# Patient Record
Sex: Female | Born: 1935 | Race: Black or African American | Hispanic: No | Smoking: Never smoker
Health system: Southern US, Community
[De-identification: ages and names within clinical notes are randomized; demographics above are authoritative.]

## PROBLEM LIST (undated history)

## (undated) DIAGNOSIS — I10 Essential (primary) hypertension: Secondary | ICD-10-CM

## (undated) DIAGNOSIS — E119 Type 2 diabetes mellitus without complications: Secondary | ICD-10-CM

## (undated) HISTORY — PX: CHOLECYSTECTOMY: SHX55

---

## 2015-06-18 ENCOUNTER — Inpatient Hospital Stay (HOSPITAL_COMMUNITY)
Admission: AD | Admit: 2015-06-18 | Discharge: 2015-06-24 | DRG: 194 | Disposition: A | Discharge status: Home / Self Care | Payer: Medicare Other | Source: Other Acute Inpatient Hospital | Attending: Internal Medicine | Admitting: Internal Medicine

## 2015-06-18 ENCOUNTER — Encounter (HOSPITAL_COMMUNITY): Payer: Self-pay

## 2015-06-18 DIAGNOSIS — N179 Acute kidney failure, unspecified: Secondary | ICD-10-CM | POA: Diagnosis not present

## 2015-06-18 DIAGNOSIS — I10 Essential (primary) hypertension: Secondary | ICD-10-CM | POA: Diagnosis not present

## 2015-06-18 DIAGNOSIS — J189 Pneumonia, unspecified organism: Principal | ICD-10-CM | POA: Diagnosis present

## 2015-06-18 DIAGNOSIS — Z66 Do not resuscitate: Secondary | ICD-10-CM | POA: Diagnosis present

## 2015-06-18 DIAGNOSIS — K529 Noninfective gastroenteritis and colitis, unspecified: Secondary | ICD-10-CM | POA: Diagnosis present

## 2015-06-18 DIAGNOSIS — R918 Other nonspecific abnormal finding of lung field: Secondary | ICD-10-CM | POA: Diagnosis not present

## 2015-06-18 DIAGNOSIS — Z7982 Long term (current) use of aspirin: Secondary | ICD-10-CM

## 2015-06-18 DIAGNOSIS — E119 Type 2 diabetes mellitus without complications: Secondary | ICD-10-CM | POA: Diagnosis not present

## 2015-06-18 DIAGNOSIS — R062 Wheezing: Secondary | ICD-10-CM

## 2015-06-18 DIAGNOSIS — R05 Cough: Secondary | ICD-10-CM

## 2015-06-18 DIAGNOSIS — E860 Dehydration: Secondary | ICD-10-CM | POA: Diagnosis present

## 2015-06-18 DIAGNOSIS — R911 Solitary pulmonary nodule: Secondary | ICD-10-CM | POA: Diagnosis present

## 2015-06-18 DIAGNOSIS — R059 Cough, unspecified: Secondary | ICD-10-CM

## 2015-06-18 DIAGNOSIS — I5032 Chronic diastolic (congestive) heart failure: Secondary | ICD-10-CM | POA: Diagnosis present

## 2015-06-18 HISTORY — DX: Essential (primary) hypertension: I10

## 2015-06-18 HISTORY — DX: Type 2 diabetes mellitus without complications: E11.9

## 2015-06-18 LAB — CREATININE, SERUM
CREATININE: 1.64 mg/dL — AB (ref 0.44–1.00)
GFR, EST AFRICAN AMERICAN: 31 mL/min — AB (ref >60)
GFR, EST NON AFRICAN AMERICAN: 27 mL/min — AB (ref >60)

## 2015-06-18 LAB — CBC
HEMATOCRIT: 39.6 % (ref 36.0–46.0)
HEMOGLOBIN: 13 g/dL (ref 12.0–15.0)
MCH: 28.8 pg (ref 26.0–34.0)
MCHC: 32.8 g/dL (ref 30.0–36.0)
MCV: 87.8 fL (ref 78.0–100.0)
Platelets: 255 10*3/uL (ref 150–400)
RBC: 4.51 MIL/uL (ref 3.87–5.11)
RDW: 15.5 % (ref 11.5–15.5)
WBC: 13.8 10*3/uL — AB (ref 4.0–10.5)

## 2015-06-18 LAB — GLUCOSE, CAPILLARY
GLUCOSE-CAPILLARY: 131 mg/dL — AB (ref 65–99)
GLUCOSE-CAPILLARY: 164 mg/dL — AB (ref 65–99)

## 2015-06-18 MED ORDER — ATORVASTATIN CALCIUM 20 MG PO TABS
20.0000 mg | ORAL_TABLET | Freq: Every day | ORAL | Status: DC
  Administered 2015-06-18 – 2015-06-24 (×7): 20 mg via ORAL
  Filled 2015-06-18 (×7): qty 1

## 2015-06-18 MED ORDER — ASPIRIN 81 MG PO CHEW
81.0000 mg | CHEWABLE_TABLET | Freq: Every day | ORAL | Status: DC
  Administered 2015-06-18 – 2015-06-24 (×7): 81 mg via ORAL
  Filled 2015-06-18 (×7): qty 1

## 2015-06-18 MED ORDER — SODIUM CHLORIDE 0.9 % IV SOLN: 250.0000 mL | INTRAVENOUS | Status: DC | PRN

## 2015-06-18 MED ORDER — LORATADINE 10 MG PO TABS
10.0000 mg | ORAL_TABLET | Freq: Every day | ORAL | Status: DC
  Administered 2015-06-19 – 2015-06-24 (×6): 10 mg via ORAL
  Filled 2015-06-18 (×7): qty 1

## 2015-06-18 MED ORDER — SODIUM CHLORIDE 0.9% FLUSH
3.0000 mL | Freq: Two times a day (BID) | INTRAVENOUS | Status: DC
  Administered 2015-06-19 – 2015-06-22 (×4): 3 mL via INTRAVENOUS

## 2015-06-18 MED ORDER — SODIUM CHLORIDE 0.9% FLUSH: 3.0000 mL | INTRAVENOUS | Status: DC | PRN

## 2015-06-18 MED ORDER — DEXTROSE 5 % IV SOLN
1.0000 g | Freq: Three times a day (TID) | INTRAVENOUS | Status: DC
  Administered 2015-06-18 – 2015-06-19 (×2): 1 g via INTRAVENOUS
  Filled 2015-06-18 (×4): qty 1

## 2015-06-18 MED ORDER — AMLODIPINE BESYLATE 10 MG PO TABS
10.0000 mg | ORAL_TABLET | Freq: Every day | ORAL | Status: DC
  Administered 2015-06-19: 10 mg via ORAL
  Filled 2015-06-18 (×3): qty 1

## 2015-06-18 MED ORDER — INSULIN ASPART 100 UNIT/ML ~~LOC~~ SOLN: 0.0000 [IU] | Freq: Every day | SUBCUTANEOUS | Status: DC

## 2015-06-18 MED ORDER — CARVEDILOL 25 MG PO TABS
25.0000 mg | ORAL_TABLET | Freq: Two times a day (BID) | ORAL | Status: DC
  Administered 2015-06-18 – 2015-06-24 (×12): 25 mg via ORAL
  Filled 2015-06-18 (×12): qty 1

## 2015-06-18 MED ORDER — ALUM & MAG HYDROXIDE-SIMETH 200-200-20 MG/5ML PO SUSP: 30.0000 mL | Freq: Four times a day (QID) | ORAL | Status: DC | PRN

## 2015-06-18 MED ORDER — ACETAMINOPHEN 650 MG RE SUPP: 650.0000 mg | Freq: Four times a day (QID) | RECTAL | Status: DC | PRN

## 2015-06-18 MED ORDER — LISINOPRIL-HYDROCHLOROTHIAZIDE 20-12.5 MG PO TABS: 1.0000 | ORAL_TABLET | Freq: Every day | ORAL | Status: DC

## 2015-06-18 MED ORDER — ONDANSETRON HCL 4 MG/2ML IJ SOLN
4.0000 mg | Freq: Four times a day (QID) | INTRAMUSCULAR | Status: DC | PRN
  Administered 2015-06-22: 4 mg via INTRAVENOUS
  Filled 2015-06-18: qty 2

## 2015-06-18 MED ORDER — LEVOCETIRIZINE DIHYDROCHLORIDE 5 MG PO TABS: 5.0000 mg | ORAL_TABLET | Freq: Two times a day (BID) | ORAL | Status: DC

## 2015-06-18 MED ORDER — ONDANSETRON HCL 4 MG PO TABS: 4.0000 mg | ORAL_TABLET | Freq: Four times a day (QID) | ORAL | Status: DC | PRN

## 2015-06-18 MED ORDER — POTASSIUM CHLORIDE CRYS ER 20 MEQ PO TBCR
20.0000 meq | EXTENDED_RELEASE_TABLET | Freq: Every day | ORAL | Status: DC
  Administered 2015-06-19: 20 meq via ORAL
  Filled 2015-06-18 (×2): qty 1

## 2015-06-18 MED ORDER — INSULIN ASPART 100 UNIT/ML ~~LOC~~ SOLN
0.0000 [IU] | Freq: Three times a day (TID) | SUBCUTANEOUS | Status: DC
  Administered 2015-06-18 – 2015-06-19 (×3): 1 [IU] via SUBCUTANEOUS
  Administered 2015-06-20: 2 [IU] via SUBCUTANEOUS
  Administered 2015-06-20: 1 [IU] via SUBCUTANEOUS
  Administered 2015-06-21: 2 [IU] via SUBCUTANEOUS
  Administered 2015-06-21 – 2015-06-22 (×2): 1 [IU] via SUBCUTANEOUS
  Administered 2015-06-22: 2 [IU] via SUBCUTANEOUS
  Administered 2015-06-22 – 2015-06-23 (×3): 3 [IU] via SUBCUTANEOUS
  Administered 2015-06-24: 1 [IU] via SUBCUTANEOUS

## 2015-06-18 MED ORDER — ACETAMINOPHEN 325 MG PO TABS: 650.0000 mg | ORAL_TABLET | Freq: Four times a day (QID) | ORAL | Status: DC | PRN

## 2015-06-18 MED ORDER — ENOXAPARIN SODIUM 40 MG/0.4ML ~~LOC~~ SOLN
40.0000 mg | SUBCUTANEOUS | Status: DC
  Administered 2015-06-18 – 2015-06-19 (×2): 40 mg via SUBCUTANEOUS
  Filled 2015-06-18 (×2): qty 0.4

## 2015-06-18 MED ORDER — DEXTROSE 5 % IV SOLN
500.0000 mg | INTRAVENOUS | Status: DC
  Administered 2015-06-19: 500 mg via INTRAVENOUS
  Filled 2015-06-18: qty 500

## 2015-06-18 NOTE — Progress Notes (Signed)
ANTIBIOTIC CONSULT NOTE - INITIAL  Pharmacy Consult for Azactam Indication: pneumonia  Allergies  Allergen Reactions  . Levaquin [Levofloxacin In D5w] Other (See Comments)    Veins redden per [place] ,[place] [facility] 06/18/15  . Penicillins Swelling and Other (See Comments)    hallucination    Patient Measurements: Height:  (165.1 cm) Weight: 233 lb 14.4 oz (106.096 kg) IBW/kg (Calculated) : 57 Adjusted Body Weight: n/a  Vital Signs: Temp: 99.1 F (37.3 C) (02/04 1457) Temp Source: Oral (02/04 1457) BP: 113/55 mmHg (02/04 1457) Pulse Rate: 73 (02/04 1457) Intake/Output from previous day:   Intake/Output from this shift:    Labs: From [place] : Scr 1.62, est CrCl ~ 25 ml/min  Microbiology: No results found for this or any previous visit (from the past 720 hour(s)).  Assessment: 82 yo female transferred from [facility] with vomiting and diarrhea, hypoxic.  Pharmacy asked to begin empiric antibiotic coverage for suspected PNA.  Allergy to levaquin and PCN.  Received azactam at [facility] around 10 AM today.    Goal of Therapy:  Resolution of infection  Plan:  1. Azactam 1g IV q 8 hrs. 2. F/u renal function, cultures and clinical course.  Tad Moore, BCPS  Clinical Pharmacist Pager [phone]  06/18/2015 4:36 PM

## 2015-06-18 NOTE — H&P (Signed)
[facility] History and Physical  Ann Garrison ZOX:[id number] DOB: 13-Jun-1925 DOA: 06/18/2015   PCP: No primary care provider on file.    Chief Complaint: Vomiting and diarrhea  HPI: Ann Garrison is a 82 y.o. female history of hypertension and diabetes mellitus who presents to the hospital for vomiting and diarrhea that started last night shortly after 11 PM.  She continued to have at this morning and never presented to the hospital.  She initially presented to the hospital in [place].  She was receiving a fluid bolus when she was suddenly noticed to be hypoxic.  Further workup revealed bilateral pulmonary infiltrates.  It was suspected that she may have pulmonary edema and therefore she was given Lasix however, a BNP which was obtained was found to be normal.  It was subsequently suspected that she may have pneumonia.  The patient does not admit to any shortness of breath cough fever or chills.  She was given IV Levaquin in the [facility] but developed redness and itching and therefore this was continued.  She was then given IV Azactam subsequently transported to [facility] asked me were no beds available in [place]. Here she states that her nausea vomiting and diarrhea have not recurred since early this morning.  She does not have any abdominal pain.  He states that she went out to eat on Wednesday with her family.  Both her and her daughter had a salad and her daughter developed nausea and vomiting immediately however the patient's symptoms did not start until last night.   She continues to deny cough, chest congestion, sore throat, runny nose, fevers and chills.    General: The patient denies anorexia, fever, weight loss Cardiac: Denies chest pain, syncope, palpitations, pedal edema  Respiratory: Denies cough, shortness of breath, wheezing GI: Denies severe indigestion/heartburn, abdominal pain, nausea, vomiting, diarrhea and constipation GU: Denies hematuria,  incontinence, dysuria  Musculoskeletal: Denies arthritis  Skin: Denies suspicious skin lesions Neurologic: Denies focal weakness or numbness, change in vision Psychiatry: Denies depression or anxiety. Hematologic: no easy bruising or bleeding  All other systems reviewed and found to be negative.  No past medical history on file.  No past surgical history on file.  Social History:* does not smoke or drink. Lives at home. pretty active at home.    Allergies  Allergen Reactions  . Levaquin [Levofloxacin In D5w] Other (See Comments)    Veins redden [facility]  . Penicillins Swelling and Other (See Comments)    hallucination    Family history:   Per patient, family history of cancer her heart disease    Prior to Admission medications   Medication Sig Start Date End Date Taking? Authorizing Provider  amLODipine (NORVASC) 10 MG tablet Take 10 mg by mouth daily.   Yes Historical Provider, MD  aspirin 81 MG chewable tablet Chew by mouth daily.   Yes Historical Provider, MD  atorvastatin (LIPITOR) 20 MG tablet Take 20 mg by mouth daily.   Yes Historical Provider, MD  carvedilol (COREG) 25 MG tablet Take 25 mg by mouth 2 (two) times daily with a meal.   Yes Historical Provider, MD  glimepiride (AMARYL) 4 MG tablet Take 4 mg by mouth daily with breakfast.   Yes Historical Provider, MD  levocetirizine (XYZAL) 5 MG tablet Take 5 mg by mouth 2 (two) times daily.   Yes Historical Provider, MD  lisinopril-hydrochlorothiazide (PRINZIDE,ZESTORETIC) 20-12.5 MG tablet Take 1 tablet by mouth daily.   Yes Historical Provider, MD  potassium chloride SA (K-DUR,KLOR-CON) 20 MEQ tablet Take 20 mEq by mouth daily.   Yes Historical Provider, MD     Physical Exam: Filed Vitals:   06/18/15 1457  BP: 113/55  Pulse: 73  Temp: 99.1 F (37.3 C)  TempSrc: Oral  Resp: 16  Height:  (1.651 m)  Weight: 106.096 kg (233 lb 14.4 oz)  SpO2: 96%     General: awake alert oriented 3, in  no acute distress.   HEENT: Normocephalic and Atraumatic, Mucous membranes pink                PERRLA; EOM intact; No scleral icterus,                 Nares: Patent, Oropharynx: Clear, Fair Dentition                 Neck: FROM, no cervical lymphadenopathy, thyromegaly, carotid bruit or JVD;  Breasts: deferred CHEST WALL: No tenderness  CHEST: Normal respiration, clear to auscultation bilaterally  HEART: Regular rate and rhythm; no murmurs rubs or gallops  BACK: No kyphosis or scoliosis; no CVA tenderness  GI: Positive Bowel Sounds, soft, non-tender; no masses, no organomegaly Rectal Exam: deferred MSK: No cyanosis, clubbing, or edema Genitalia: not examined  SKIN:  no rash or ulceration  CNS: Alert and Oriented x 4, Nonfocal exam, CN 2-12 intact  Labs on Admission:    lab work from [facility] in paper chart has been reviewed - sodium 141, potassium 3.5, chloride 102, carbon dioxide 30, BUN 28, creatinine 1.62   Total bilirubin 0.57, AST 25, ALT 20, alkaline phosphatase 63   Telemetry DC'd 9.5, hemoglobin 13.3, hematocrit 42 , platelets 340   UA reveals 2+ leukocytes and 20-40 WBCs many bacteria and a moderate amount of epithelial cells   Chest x-ray reveals bilateral basilar infiltrates per report in chart EKG: Independently reviewed.   Assessment/Plan Principal Problem:   Gastroenteritis -  Presenting with symptoms of vomiting and diarrhea which already seem to be improving-she has no abdominal tenderness- last episode of vomiting was around 6 AM -  We'll give symptomatic treatment with Zofran when necessary - placed on clear liquids & monitor for improvement  Active Problems: Pulmonary infiltrates on CXR -  Uncertain if this is secondary to underlying pneumonia she is asymptomatic in regards to pneumonia - The probability of heart failure is low as BNP is normal  We'll obtain a 2-D echo  to further Evaluate - currently oxygen saturation is 96% on room air -  she  receivedIV Levaquin in the ER but due to an allergic reaction, she was switched to Azactam-if this actually is community-acquired pneumonia, she will need atypical coverage and therefore will add Zithromax as well - repeat chest x-ray tomorrow morning to see if infiltrates have resolved which would mean that they may have been secondary to pulmonary edema rather than pneumonia -  Influenza checked in [place] was negative  Allergic reaction to Levaquin -  Has been noted in the chart  Renal failure- unspecified - BUN 28 /creatinine 1.62 - as there is no prior blood work to compare with, I am unable to tell if this is acute or chronic - unable to give IV fluids as we are currently investigating to see if she has heart failure - recheck tomorrow morning-     Pyuria - Contaminated UA due to significant amount of epithelial cells - No complaints of dysuria , increased frequency or suprapubic pain -doubtful that she has  a UTI    DM type 2 goal A1C below 7.5 -   hold Amaryl and place on sliding scale insulin    Benign essential HTN   -   Resume home medications with holding parameters   Consulted:   Code Status: DNR Family Communication:   DVT Prophylaxis:Lovenox  Time spent: 60 minutes  Docia Klar, MD [facility]  If 7PM-7AM, please contact night-coverage [email or web address] 06/18/2015, 4:10 PM

## 2015-06-18 NOTE — Progress Notes (Signed)
Ann Garrison [id number] Admission Data: 06/18/2015 3:24 PM Attending Provider: Calvert Cantor, MD  PCP:No primary care provider on file. Consults/ Treatment Team:    Ann Garrison is a 82 y.o. female patient admitted from ED awake, alert  & orientated  X 3,  No Order, VSS - Blood pressure 113/55, pulse 73, temperature 99.1 F (37.3 C), temperature source Oral, resp. rate 16, SpO2 96 %., O2    2 L nasal cannular, no c/o shortness of breath, no c/o chest pain, no distress noted.   IV site WDL:  Left forearm with a transparent dsg that's clean dry and intact.  Allergies: Penicillin, Levoquin  No past medical history on file.  Pt orientation to unit, room and routine. Information packet given to patient/family and safety video watched.  Admission INP armband ID verified with patient/family, and in place. SR up x 2, fall risk assessment complete with Patient and family verbalizing understanding of risks associated with falls. Pt verbalizes an understanding of how to use the call bell and to call for help before getting out of bed.  Skin, clean-dry- intact without evidence of bruising, or skin tears.   No evidence of skin break down noted on exam. Skin assessment done with Velva Harman.   Will cont to monitor and assist as needed.  Tobin Chad, RN 06/18/2015 3:26 PM

## 2015-06-19 ENCOUNTER — Encounter (HOSPITAL_COMMUNITY): Payer: Self-pay

## 2015-06-19 ENCOUNTER — Observation Stay (HOSPITAL_BASED_OUTPATIENT_CLINIC_OR_DEPARTMENT_OTHER): Payer: Medicare Other

## 2015-06-19 ENCOUNTER — Observation Stay (HOSPITAL_COMMUNITY): Payer: Medicare Other

## 2015-06-19 DIAGNOSIS — I10 Essential (primary) hypertension: Secondary | ICD-10-CM | POA: Diagnosis present

## 2015-06-19 DIAGNOSIS — R918 Other nonspecific abnormal finding of lung field: Secondary | ICD-10-CM | POA: Diagnosis not present

## 2015-06-19 DIAGNOSIS — Z66 Do not resuscitate: Secondary | ICD-10-CM | POA: Diagnosis present

## 2015-06-19 DIAGNOSIS — E119 Type 2 diabetes mellitus without complications: Secondary | ICD-10-CM | POA: Diagnosis present

## 2015-06-19 DIAGNOSIS — Z7982 Long term (current) use of aspirin: Secondary | ICD-10-CM | POA: Diagnosis not present

## 2015-06-19 DIAGNOSIS — N179 Acute kidney failure, unspecified: Secondary | ICD-10-CM | POA: Diagnosis not present

## 2015-06-19 DIAGNOSIS — R079 Chest pain, unspecified: Secondary | ICD-10-CM | POA: Diagnosis not present

## 2015-06-19 DIAGNOSIS — E860 Dehydration: Secondary | ICD-10-CM | POA: Diagnosis present

## 2015-06-19 DIAGNOSIS — K529 Noninfective gastroenteritis and colitis, unspecified: Secondary | ICD-10-CM | POA: Diagnosis present

## 2015-06-19 DIAGNOSIS — J189 Pneumonia, unspecified organism: Secondary | ICD-10-CM | POA: Diagnosis present

## 2015-06-19 DIAGNOSIS — R911 Solitary pulmonary nodule: Secondary | ICD-10-CM | POA: Diagnosis present

## 2015-06-19 DIAGNOSIS — I5032 Chronic diastolic (congestive) heart failure: Secondary | ICD-10-CM | POA: Diagnosis present

## 2015-06-19 LAB — CBC
HCT: 34.9 % — ABNORMAL LOW (ref 36.0–46.0)
HEMOGLOBIN: 11.6 g/dL — AB (ref 12.0–15.0)
MCH: 29.1 pg (ref 26.0–34.0)
MCHC: 33.2 g/dL (ref 30.0–36.0)
MCV: 87.7 fL (ref 78.0–100.0)
PLATELETS: 242 10*3/uL (ref 150–400)
RBC: 3.98 MIL/uL (ref 3.87–5.11)
RDW: 16 % — ABNORMAL HIGH (ref 11.5–15.5)
WBC: 13.7 10*3/uL — AB (ref 4.0–10.5)

## 2015-06-19 LAB — BASIC METABOLIC PANEL
ANION GAP: 12 (ref 5–15)
BUN: 40 mg/dL — ABNORMAL HIGH (ref 6–20)
CALCIUM: 8.6 mg/dL — AB (ref 8.9–10.3)
CO2: 29 mmol/L (ref 22–32)
CREATININE: 2.43 mg/dL — AB (ref 0.44–1.00)
Chloride: 99 mmol/L — ABNORMAL LOW (ref 101–111)
GFR, EST AFRICAN AMERICAN: 19 mL/min — AB (ref >60)
GFR, EST NON AFRICAN AMERICAN: 17 mL/min — AB (ref >60)
Glucose, Bld: 114 mg/dL — ABNORMAL HIGH (ref 65–99)
Potassium: 3.7 mmol/L (ref 3.5–5.1)
SODIUM: 140 mmol/L (ref 135–145)

## 2015-06-19 LAB — GLUCOSE, CAPILLARY
GLUCOSE-CAPILLARY: 148 mg/dL — AB (ref 65–99)
GLUCOSE-CAPILLARY: 98 mg/dL (ref 65–99)
GLUCOSE-CAPILLARY: 114 mg/dL — AB (ref 65–99)
Glucose-Capillary: 122 mg/dL — ABNORMAL HIGH (ref 65–99)

## 2015-06-19 MED ORDER — SODIUM CHLORIDE 0.9 % IV SOLN
INTRAVENOUS | Status: DC
  Administered 2015-06-19 (×2): via INTRAVENOUS

## 2015-06-19 NOTE — Progress Notes (Signed)
[facility] Progress Note   Ann Garrison  YNW:[id number]  DOB: 1925-06-01  DOA: 06/18/2015 PCP: No primary care provider on file.  Brief narrative: Ann Garrison is a 82 y.o. female with a history of hypertension and diabetes mellitus who presents to the hospital for vomiting and diarrhea that started last night shortly after 11 PM. She continued to have at this morning and never presented to the hospital. She initially presented to the hospital in [place]. She was receiving a fluid bolus when she was suddenly noticed to be hypoxic. Further workup revealed bilateral pulmonary infiltrates. It was suspected that she may have pulmonary edema and therefore she was given Lasix however, a BNP which was obtained was found to be normal. It was subsequently suspected that she may have pneumonia. The patient did not admit to any shortness of breath cough fever or chills. She was given IV Levaquin in the [facility] ER but developed redness and itching and therefore this was discontinued. She was given IV Azactam subsequently transported to [facility] as there were no beds available in [place].   Subjective: No further nausea vomiting abdominal pain or diarrhea. She had a mild cough this morning but has not had any since then or prior to then. No fever chills sweats. No chest pain. No shortness of breath  Assessment/Plan: Principal Problem:   Gastroenteritis -Appears to be improving-have advanced to soft diet and will follow for recurrence of symptoms Active Problems:    Pulmonary infiltrates on CXR -Repeat chest x-ray today appears to suggest a possible underlying mass/nodules-she has no symptoms of pneumonia, would like to obtain a CT scan to further evaluate. However, creatinine has risen today (likely because she received Lasix in the ER yesterday) and therefore would like to hydrate her to see her renal function improved before a CT with contrast can be ordered-have  discussed with radiology who recommend that contrast BE given otherwise will be difficult to differentiate a mass from other pulmonary structures - We'll stop antibiotics as she has not developed any symptoms of pneumonia -Follow-up on echo to ensure infiltrates were not secondary to heart failure  AKI - A sliding creatinine is unknown to me at this time as I have no prior blood work to compare with -Creatinine has risen from 1.65 yesterday to 2.43 today which may be secondary to receiving Lasix in the ER yesterday and having poor by mouth intake over the past couple of days -Hydrate with normal saline and follow creatinine    DM type 2 goal A1C below 7.5 -Continue sliding scale insulin with meals for now-holding Amaryl     Benign essential HTN -We'll hold ACE inhibitor/HCTZ due to elevation in creatinine today which is likely prerenal-of note the patient received these medications this morning -Continue amlodipine    Antibiotics: Anti-infectives    Start     Dose/Rate Route Frequency Ordered Stop   06/18/15 1800  aztreonam (AZACTAM) 1 g in dextrose 5 % 50 mL IVPB  Status:  Discontinued     1 g 100 mL/hr over 30 Minutes Intravenous 3 times per day 06/18/15 1637 06/19/15 1216   06/18/15 1700  azithromycin (ZITHROMAX) 500 mg in dextrose 5 % 250 mL IVPB  Status:  Discontinued     500 mg 250 mL/hr over 60 Minutes Intravenous Every 24 hours 06/18/15 1621 06/19/15 1216     Code Status:     Code Status Orders        Start     Ordered  06/18/15 1559  Do not attempt resuscitation (DNR)   Continuous    Question Answer Comment  In the event of cardiac or respiratory ARREST Do not call a "code blue"   In the event of cardiac or respiratory ARREST Do not perform Intubation, CPR, defibrillation or ACLS   In the event of cardiac or respiratory ARREST Use medication by any route, position, wound care, and other measures to relive pain and suffering. May use oxygen, suction and manual  treatment of airway obstruction as needed for comfort.      06/18/15 1559    Code Status History    Date Active Date Inactive Code Status Order ID Comments User Context   This patient has a current code status but no historical code status.     Family Communication: Daughter at bedside Disposition Plan: Follow in hospital overnight 24 hours for recurrence of nausea vomiting and diarrhea-attempt to obtain CT with contrast to further evaluate chest x-ray DVT prophylaxis: Lovenox Consultants:  Procedures:     Objective: Filed Weights   06/18/15 1457  Weight: 106.096 kg (233 lb 14.4 oz)    Intake/Output Summary (Last 24 hours) at 06/19/15 1217 Last data filed at 06/19/15 1045  Gross per 24 hour  Intake    350 ml  Output    625 ml  Net   -275 ml     Vitals Filed Vitals:   06/18/15 1714 06/18/15 2112 06/19/15 0522 06/19/15 0908  BP: 116/42 111/48 139/58 127/50  Pulse: 76 73 77 75  Temp:  98.2 F (36.8 C) 99 F (37.2 C)   TempSrc:  Oral Oral   Resp:  20 20   Height:      Weight:      SpO2:  93% 93%     Exam:  General:  Pt is alert, not in acute distress  HEENT: No icterus, No thrush, oral mucosa moist  Cardiovascular: regular rate and rhythm, S1/S2 No murmur  Respiratory: clear to auscultation bilaterally   Abdomen: Soft, +Bowel sounds, non tender, non distended, no guarding  MSK: No cyanosis or clubbing- no pedal edema   Data Reviewed: Basic Metabolic Panel:  Recent Labs Lab 06/18/15 1633 06/19/15 0911  NA  --  140  K  --  3.7  CL  --  99*  CO2  --  29  GLUCOSE  --  114*  BUN  --  40*  CREATININE 1.64* 2.43*  CALCIUM  --  8.6*   Liver Function Tests: No results for input(s): AST, ALT, ALKPHOS, BILITOT, PROT, ALBUMIN in the last 168 hours. No results for input(s): LIPASE, AMYLASE in the last 168 hours. No results for input(s): AMMONIA in the last 168 hours. CBC:  Recent Labs Lab 06/18/15 1633 06/19/15 0911  WBC 13.8* 13.7*  HGB 13.0  11.6*  HCT 39.6 34.9*  MCV 87.8 87.7  PLT 255 242   Cardiac Enzymes: No results for input(s): CKTOTAL, CKMB, CKMBINDEX, TROPONINI in the last 168 hours. BNP (last 3 results) No results for input(s): BNP in the last 8760 hours.  ProBNP (last 3 results) No results for input(s): PROBNP in the last 8760 hours.  CBG:  Recent Labs Lab 06/18/15 1711 06/18/15 2110 06/19/15 0823 06/19/15 1203  GLUCAP 131* 164* 122* 148*    No results found for this or any previous visit (from the past 240 hour(s)).   Studies: Dg Chest 2 View  06/19/2015  CLINICAL DATA:  Pulmonary infiltrates; Pt reports fatigue, weakness, emesis and diarrhea  x 2 days; she denies CP or SOB EXAM: CHEST  2 VIEW COMPARISON:  None. FINDINGS: Normal cardiac silhouette. There is a airspace opacity in the LEFT lower lobe superior segment. A fine nodular opacities in the RIGHT lower lobe. Upper lungs are clear. IMPRESSION: 1. Opacity in the superior segment of the LEFT lower lobe consistent with pneumonia versus pulmonary mass. No comparison available. Followup PA and lateral chest X-ray is recommended in 3-4 weeks following trial of antibiotic therapy to ensure resolution and exclude underlying malignancy. 2. Nodular opacities in the RIGHT lower lobe represent infection or neoplasm additionally. Electronically Signed   By: Genevive Bi M.D.   On: 06/19/2015 08:24    Scheduled Meds:  Scheduled Meds: . amLODipine  10 mg Oral Daily  . aspirin  81 mg Oral Daily  . atorvastatin  20 mg Oral Daily  . carvedilol  25 mg Oral BID WC  . enoxaparin (LOVENOX) injection  40 mg Subcutaneous Q24H  . insulin aspart  0-5 Units Subcutaneous QHS  . insulin aspart  0-9 Units Subcutaneous TID WC  . loratadine  10 mg Oral Daily  . potassium chloride SA  20 mEq Oral Daily  . sodium chloride flush  3 mL Intravenous Q12H   Continuous Infusions: . sodium chloride      Time spent on care of this patient: 25 min   Mccormick Macon,  MD 06/19/2015, 12:17 PM  LOS: 1 day   [facility] Office  [phone] Pager - Text Page per [email or web address] If 7PM-7AM, please contact night-coverage [email or web address]

## 2015-06-19 NOTE — Progress Notes (Signed)
  Echocardiogram 2D Echocardiogram has been performed.  Ann Garrison 06/19/2015, 2:44 PM

## 2015-06-20 ENCOUNTER — Inpatient Hospital Stay (HOSPITAL_COMMUNITY): Payer: Medicare Other

## 2015-06-20 LAB — GLUCOSE, CAPILLARY
GLUCOSE-CAPILLARY: 173 mg/dL — AB (ref 65–99)
GLUCOSE-CAPILLARY: 109 mg/dL — AB (ref 65–99)
Glucose-Capillary: 84 mg/dL (ref 65–99)
Glucose-Capillary: 138 mg/dL — ABNORMAL HIGH (ref 65–99)

## 2015-06-20 LAB — LACTIC ACID, PLASMA
Lactic Acid, Venous: 1 mmol/L (ref 0.5–2.0)
Lactic Acid, Venous: 1 mmol/L (ref 0.5–2.0)

## 2015-06-20 LAB — STREP PNEUMONIAE URINARY ANTIGEN: Strep Pneumo Urinary Antigen: NEGATIVE

## 2015-06-20 MED ORDER — DEXTROSE 5 % IV SOLN
1.0000 g | INTRAVENOUS | Status: DC
  Administered 2015-06-20 – 2015-06-24 (×5): 1 g via INTRAVENOUS
  Filled 2015-06-20 (×5): qty 10

## 2015-06-20 MED ORDER — SODIUM CHLORIDE 0.9 % IV BOLUS (SEPSIS): 500.0000 mL | Freq: Once | INTRAVENOUS | Status: DC

## 2015-06-20 MED ORDER — ENOXAPARIN SODIUM 30 MG/0.3ML ~~LOC~~ SOLN
30.0000 mg | SUBCUTANEOUS | Status: DC
  Administered 2015-06-20: 30 mg via SUBCUTANEOUS
  Filled 2015-06-20: qty 0.3

## 2015-06-20 MED ORDER — AZITHROMYCIN 500 MG PO TABS
500.0000 mg | ORAL_TABLET | ORAL | Status: DC
  Administered 2015-06-20 – 2015-06-24 (×5): 500 mg via ORAL
  Filled 2015-06-20 (×5): qty 1

## 2015-06-20 NOTE — Progress Notes (Signed)
RN paged MD to see if Foley can be removed, pt had it for strict I&O-pt IVF was decreased today. RN awaiting MD at this time. Tobin Chad 06/20/2015 6:55 PM

## 2015-06-20 NOTE — Progress Notes (Signed)
[facility] PROGRESS NOTE    Progress Note   Ann Garrison ZOX:[id number] DOB: Dec 22, 1925 DOA: 06/18/2015 PCP: No primary care provider on file.   Brief Narrative:   Ann Garrison is an 82 y.o. female 82 y.o. female with a history of hypertension and diabetes mellitus who presents to the hospital for vomiting and diarrhea that started last night shortly after 11 PM. She continued to have at this morning and never presented to the hospital. She initially presented to the hospital in [place]. She was receiving a fluid bolus when she was suddenly noticed to be hypoxic. Further workup revealed bilateral pulmonary infiltrates. It was suspected that she may have pulmonary edema and therefore she was given Lasix however, a BNP which was obtained was found to be normal. It was subsequently suspected that she may have pneumonia. The patient did not admit to any shortness of breath cough fever or chills. She was given IV Levaquin in the [facility] but developed redness and itching and therefore this was discontinued.  Assessment/Plan:   Gastroenteritis: Seems to be improving tolerating diet. Pneumonia can present with nausea vomiting diarrhea.  Pulmonary infiltrate on x-ray: A CT scan of the chest with contrast once creatinine improves. Cough, leukocytosis I am concerned about a community-acquired pneumonia, we'll start empiric antibiotics. Get CT scan of the chest without contrast. Check a lactate.  Acute kidney injury: Unknown baseline, her creatinine was 1.6 with worsening to 2.4 question due to Lasix received ER. Recheck a basic metabolic panel given normal saline bolus and continue normal saline infusion. She had an echo in 2.5201 showed an EF of 65% with grade 2 diastolic heart failure.  DM type 2 goal A1C below 7.5: Continue sliding scale insulin hold Amaryl.  Benign essential HTN Continue to hold ACE inhibitor and HCTZ. DC amlodipine.    DVT Prophylaxis - Lovenox  ordered.  Family Communication: none Disposition Plan: Home 3-4 days Code Status:     Code Status Orders        Start     Ordered   06/18/15 1559  Do not attempt resuscitation (DNR)   Continuous    Question Answer Comment  In the event of cardiac or respiratory ARREST Do not call a "code blue"   In the event of cardiac or respiratory ARREST Do not perform Intubation, CPR, defibrillation or ACLS   In the event of cardiac or respiratory ARREST Use medication by any route, position, wound care, and other measures to relive pain and suffering. May use oxygen, suction and manual treatment of airway obstruction as needed for comfort.      06/18/15 1559    Code Status History    Date Active Date Inactive Code Status Order ID Comments User Context   This patient has a current code status but no historical code status.        IV Access:    Peripheral IV   Procedures and diagnostic studies:   Dg Chest 2 View  06/19/2015  CLINICAL DATA:  Pulmonary infiltrates; Pt reports fatigue, weakness, emesis and diarrhea x 2 days; she denies CP or SOB EXAM: CHEST  2 VIEW COMPARISON:  None. FINDINGS: Normal cardiac silhouette. There is a airspace opacity in the LEFT lower lobe superior segment. A fine nodular opacities in the RIGHT lower lobe. Upper lungs are clear. IMPRESSION: 1. Opacity in the superior segment of the LEFT lower lobe consistent with pneumonia versus pulmonary mass. No comparison available. Followup PA and lateral chest X-ray is recommended in  3-4 weeks following trial of antibiotic therapy to ensure resolution and exclude underlying malignancy. 2. Nodular opacities in the RIGHT lower lobe represent infection or neoplasm additionally. Electronically Signed   By: Genevive Bi M.D.   On: 06/19/2015 08:24     Medical Consultants:    None.  Anti-Infectives:   Anti-infectives    Start     Dose/Rate Route Frequency Ordered Stop   06/18/15 1800  aztreonam (AZACTAM) 1 g in  dextrose 5 % 50 mL IVPB  Status:  Discontinued     1 g 100 mL/hr over 30 Minutes Intravenous 3 times per day 06/18/15 1637 06/19/15 1216   06/18/15 1700  azithromycin (ZITHROMAX) 500 mg in dextrose 5 % 250 mL IVPB  Status:  Discontinued     500 mg 250 mL/hr over 60 Minutes Intravenous Every 24 hours 06/18/15 1621 06/19/15 1216      Subjective:    Ann Garrison   Objective:    Filed Vitals:   06/19/15 0908 06/19/15 1657 06/19/15 2122 06/20/15 0619  BP: 127/50 130/52 139/55 136/55  Pulse: 75 67 67 71  Temp:  98.5 F (36.9 C)  98.4 F (36.9 C)  TempSrc:  Oral  Oral  Resp:  Height:      Weight:      SpO2:  93% 97% 99%    Intake/Output Summary (Last 24 hours) at 06/20/15 0910 Last data filed at 06/19/15 2319  Gross per 24 hour  Intake 704.17 ml  Output    500 ml  Net 204.17 ml   Filed Weights   06/18/15 1457  Weight: 106.096 kg (233 lb 14.4 oz)    Exam: Gen:  NAD Cardiovascular:  RRR, + HJ reflex. Chest and lungs:   Good air movement, with crackles on the right. Abdomen:  Abdomen soft, NT/ND, + BS Extremities:  No Cedema   Data Reviewed:    Labs: Basic Metabolic Panel:  Recent Labs Lab 06/18/15 1633 06/19/15 0911  NA  --  140  K  --  3.7  CL  --  99*  CO2  --  29  GLUCOSE  --  114*  BUN  --  40*  CREATININE 1.64* 2.43*  CALCIUM  --  8.6*   GFR Estimated Creatinine Clearance: 19.7 mL/min (by C-G formula based on Cr of 2.43). Liver Function Tests: No results for input(s): AST, ALT, ALKPHOS, BILITOT, PROT, ALBUMIN in the last 168 hours. No results for input(s): LIPASE, AMYLASE in the last 168 hours. No results for input(s): AMMONIA in the last 168 hours. Coagulation profile No results for input(s): INR, PROTIME in the last 168 hours.  CBC:  Recent Labs Lab 06/18/15 1633 06/19/15 0911  WBC 13.8* 13.7*  HGB 13.0 11.6*  HCT 39.6 34.9*  MCV 87.8 87.7  PLT 255 242   Cardiac Enzymes: No results for input(s): CKTOTAL, CKMB, CKMBINDEX,  TROPONINI in the last 168 hours. BNP (last 3 results) No results for input(s): PROBNP in the last 8760 hours. CBG:  Recent Labs Lab 06/19/15 0823 06/19/15 1203 06/19/15 1657 06/19/15 2118 06/20/15 0753  GLUCAP 122* 148* 98 114* 84   D-Dimer: No results for input(s): DDIMER in the last 72 hours. Hgb A1c: No results for input(s): HGBA1C in the last 72 hours. Lipid Profile: No results for input(s): CHOL, HDL, LDLCALC, TRIG, CHOLHDL, LDLDIRECT in the last 72 hours. Thyroid function studies: No results for input(s): TSH, T4TOTAL, T3FREE, THYROIDAB in the last 72 hours.  Invalid input(s):  FREET3 Anemia work up: No results for input(s): VITAMINB12, FOLATE, FERRITIN, TIBC, IRON, RETICCTPCT in the last 72 hours. Sepsis Labs:  Recent Labs Lab 06/18/15 1633 06/19/15 0911  WBC 13.8* 13.7*   Microbiology No results found for this or any previous visit (from the past 240 hour(s)).   Medications:   . amLODipine  10 mg Oral Daily  . aspirin  81 mg Oral Daily  . atorvastatin  20 mg Oral Daily  . carvedilol  25 mg Oral BID WC  . enoxaparin (LOVENOX) injection  40 mg Subcutaneous Q24H  . insulin aspart  0-5 Units Subcutaneous QHS  . insulin aspart  0-9 Units Subcutaneous TID WC  . loratadine  10 mg Oral Daily  . sodium chloride flush  3 mL Intravenous Q12H   Continuous Infusions: . sodium chloride 125 mL/hr at 06/19/15 2114    Time spent: 25 min   LOS: 2 days   Marinda Elk  [facility] Pager [phone]  *Please refer to [email or web address], password [email or web address] to get updated schedule on who will round on this patient, as hospitalists switch teams weekly. If 7PM-7AM, please contact night-coverage at [email or web address], password [email or web address] for any overnight needs.  06/20/2015, 9:10 AM

## 2015-06-20 NOTE — Progress Notes (Signed)
   06/20/15 1401  Clinical Encounter Type  Visited With Patient and family together;Patient not available;Health care provider  Visit Type Initial  Referral From Nurse;Patient  Stress Factors  Patient Stress Factors None identified  Family Stress Factors None identified   Chaplain responded to an initial visit request from admission. Patient is currently resting and family seems to be fine at this time. Chaplain support available as needed.   Alda Ponder, Chaplain 06/20/2015 2:03 PM

## 2015-06-21 ENCOUNTER — Encounter (HOSPITAL_COMMUNITY): Payer: Self-pay | Admitting: General Practice

## 2015-06-21 DIAGNOSIS — J189 Pneumonia, unspecified organism: Principal | ICD-10-CM

## 2015-06-21 LAB — CBC
HEMATOCRIT: 32.9 % — AB (ref 36.0–46.0)
HEMOGLOBIN: 11 g/dL — AB (ref 12.0–15.0)
MCH: 29.2 pg (ref 26.0–34.0)
MCHC: 33.4 g/dL (ref 30.0–36.0)
MCV: 87.3 fL (ref 78.0–100.0)
Platelets: 226 10*3/uL (ref 150–400)
RBC: 3.77 MIL/uL — ABNORMAL LOW (ref 3.87–5.11)
RDW: 14.9 % (ref 11.5–15.5)
WBC: 9.7 10*3/uL (ref 4.0–10.5)

## 2015-06-21 LAB — HIV ANTIBODY (ROUTINE TESTING W REFLEX): HIV SCREEN 4TH GENERATION: NONREACTIVE

## 2015-06-21 LAB — BASIC METABOLIC PANEL
Anion gap: 11 (ref 5–15)
BUN: 19 mg/dL (ref 6–20)
CHLORIDE: 99 mmol/L — AB (ref 101–111)
CO2: 27 mmol/L (ref 22–32)
Calcium: 8.5 mg/dL — ABNORMAL LOW (ref 8.9–10.3)
Creatinine, Ser: 1.04 mg/dL — ABNORMAL HIGH (ref 0.44–1.00)
GFR, EST AFRICAN AMERICAN: 54 mL/min — AB (ref >60)
GFR, EST NON AFRICAN AMERICAN: 47 mL/min — AB (ref >60)
Glucose, Bld: 117 mg/dL — ABNORMAL HIGH (ref 65–99)
POTASSIUM: 3.3 mmol/L — AB (ref 3.5–5.1)
SODIUM: 137 mmol/L (ref 135–145)

## 2015-06-21 LAB — GLUCOSE, CAPILLARY
GLUCOSE-CAPILLARY: 119 mg/dL — AB (ref 65–99)
GLUCOSE-CAPILLARY: 127 mg/dL — AB (ref 65–99)
GLUCOSE-CAPILLARY: 137 mg/dL — AB (ref 65–99)
Glucose-Capillary: 170 mg/dL — ABNORMAL HIGH (ref 65–99)

## 2015-06-21 MED ORDER — SODIUM CHLORIDE 0.9 % IV SOLN: 250.0000 mL | INTRAVENOUS | Status: DC | PRN

## 2015-06-21 MED ORDER — POTASSIUM CHLORIDE CRYS ER 20 MEQ PO TBCR
40.0000 meq | EXTENDED_RELEASE_TABLET | Freq: Two times a day (BID) | ORAL | Status: AC
  Administered 2015-06-21 (×2): 40 meq via ORAL
  Filled 2015-06-21 (×2): qty 2

## 2015-06-21 MED ORDER — ENOXAPARIN SODIUM 40 MG/0.4ML ~~LOC~~ SOLN
40.0000 mg | SUBCUTANEOUS | Status: DC
  Administered 2015-06-21 – 2015-06-23 (×3): 40 mg via SUBCUTANEOUS
  Filled 2015-06-21 (×3): qty 0.4

## 2015-06-21 NOTE — Evaluation (Signed)
Physical Therapy Evaluation Patient Details Name: Ann Garrison MRN: [id number] DOB: [birth year] Today's Date: 06/21/2015   History of Present Illness  Pt is a 82 y/o F admitted to the hospital for vomiting and diarrhea likely due to community acquired pneumonia.  Pt's PMH includes DM, HTN.   Clinical Impression  Pt admitted with above diagnosis. Pt currently with functional limitations due to the deficits listed below (see PT Problem List). [name] was limited by generalized fatigue, ambulating w/ min guard assist a short distance in room and requiring min assist for sit<>stand.   Pt encouraged to sit in recliner chair to her tolerance for a few hours.  Pt will have 24/7 assist available from her son at d/c.  Pt will benefit from skilled PT to increase their independence and safety with mobility to allow discharge to the venue listed below.      Follow Up Recommendations Home health PT;Supervision for mobility/OOB    Equipment Recommendations  None recommended by PT    Recommendations for Other Services OT consult     Precautions / Restrictions Precautions Precautions: Fall Precaution Comments: watch O2 Restrictions Weight Bearing Restrictions: No      Mobility  Bed Mobility Overal bed mobility: Needs Assistance Bed Mobility: Supine to Sit     Supine to sit: Min guard;HOB elevated     General bed mobility comments: Use of bed rail w/ HOB elevated.  Increased time, no cues or physical assist needed.  Transfers Overall transfer level: Needs assistance Equipment used: Rolling walker (2 wheeled) Transfers: Sit to/from Stand Sit to Stand: Min assist         General transfer comment: Hand held assist for first sit>stand w/ instability noted.  Min assist to boost up to standing w/ cues for hand placement and technique using RW.    Ambulation/Gait Ambulation/Gait assistance: Min guard Ambulation Distance (Feet): 30 Feet Assistive device: Rolling walker (2 wheeled) Gait  Pattern/deviations: Shuffle;Antalgic;Trunk flexed   Gait velocity interpretation: Below normal speed for age/gender General Gait Details: Cues for upright posture, pt shuffles her feet and fatigues quickly, ambulated in room only.  SpO2 remains at 91% or above on RA.  Stairs            Wheelchair Mobility    Modified Rankin (Stroke Patients Only)       Balance Overall balance assessment: Needs assistance Sitting-balance support: Bilateral upper extremity supported;Feet supported Sitting balance-Leahy Scale: Fair     Standing balance support: Bilateral upper extremity supported;During functional activity Standing balance-Leahy Scale: Poor Standing balance comment: RW or HHA for support                             Pertinent Vitals/Pain Pain Assessment: No/denies pain    Home Living Family/patient expects to be discharged to:: Private residence Living Arrangements: Children Available Help at Discharge: Family;Available 24 hours/day Type of Home: House Home Access: Ramped entrance     Home Layout: One level Home Equipment: Cane - single point;Walker - 4 wheels (rollator) Additional Comments: Pt lives w/ her son who she reports is in good physical health.    Prior Function Level of Independence: Independent with assistive device(s)         Comments: Uses cane when going to get the mail, otherwise does not use AD.     Hand Dominance        Extremity/Trunk Assessment   Upper Extremity Assessment: Generalized weakness  Lower Extremity Assessment: Generalized weakness         Communication   Communication: HOH  Cognition Arousal/Alertness: Awake/alert Behavior During Therapy: WFL for tasks assessed/performed Overall Cognitive Status: Within Functional Limits for tasks assessed                      General Comments      Exercises General Exercises - Lower Extremity Ankle Circles/Pumps: AROM;Both;10  reps;Seated Long Arc Quad: AROM;Both;10 reps;Seated      Assessment/Plan    PT Assessment Patient needs continued PT services  PT Diagnosis Generalized weakness   PT Problem List Decreased strength;Decreased activity tolerance;Decreased balance;Decreased mobility;Decreased knowledge of use of DME;Decreased safety awareness;Cardiopulmonary status limiting activity  PT Treatment Interventions DME instruction;Gait training;Functional mobility training;Therapeutic activities;Therapeutic exercise;Balance training;Neuromuscular re-education;Patient/family education   PT Goals (Current goals can be found in the Care Plan section) Acute Rehab PT Goals Patient Stated Goal: to get stronger PT Goal Formulation: With patient Time For Goal Achievement: 07/05/15 Potential to Achieve Goals: Good    Frequency Min 3X/week   Barriers to discharge        Co-evaluation               End of Session Equipment Utilized During Treatment: Gait belt;Oxygen Activity Tolerance: Patient limited by fatigue Patient left: in chair;with call bell/phone within reach;with chair alarm set Nurse Communication: Mobility status;Other (comment) (SpO2)         Time: 1610-9604 PT Time Calculation (min) (ACUTE ONLY): 29 min   Charges:   PT Evaluation $PT Eval Moderate Complexity: 1 Procedure PT Treatments $Therapeutic Exercise: 8-22 mins   PT G Codes:       Michail Jewels PT, DPT [phone] Pager: [phone] 06/21/2015, 3:02 PM

## 2015-06-21 NOTE — Progress Notes (Signed)
Pharmacist transitions of care counseling completed  Patient and family were counseled on current medications including azithromycin and rocephin.  Pt and family demonstrated understanding and had no current medication related problems.  Hazle Nordmann, PharmD Pharmacy Resident [phone]

## 2015-06-21 NOTE — Clinical Documentation Improvement (Signed)
Hospitalist  (Query response must be documented in the current medical record, not on the CDI BPA form.)  "Sepsis" was documented for the first time in the progress note by Dr. David Stall on 06/21/15 at 09:28 am  To assist with accurate code assignment, please clarify if "Sepsis" is applicable to this admission.   If applicable, please include whether or not Present on Admission (POA).  Clinical Information/Indicators: "Started on sepsis pathway, her lactulose was 1.7."  "Acute kidney injury:  Unknown baseline, her creatinine was 1.6 with worsening to 2.4 question due to Lasix received ER.  Multifactorial due to sepsis and ACE inhibitor and hydrochlorothiazide, she was started on aggressive IV fluid hydration her creatinine improved to 1.0."   Dr. David Stall progress note 06/21/15.  [facility] Admission Labs: Highest temperature 99.1 Heart Rate 73 SBP's 110's to 130's Lactic Acid x 2 - 1.0    Please exercise your independent, professional judgment when responding. A specific answer is not anticipated or expected.   Thank You, Jerral Ralph  RN BSN [facility] [phone] Health Information Management [place]

## 2015-06-21 NOTE — Care Management Important Message (Signed)
Important Message  Patient Details  Name: Ann Garrison MRN: [id number] Date of Birth: [birth year]   Medicare Important Message Given:  Yes    Rayvon Char 06/21/2015, 10:43 AMImportant Message  Patient Details  Name: Ann Garrison MRN: [id number] Date of Birth: 13-Jul-1925   Medicare Important Message Given:  Yes    [name] G 06/21/2015, 10:43 AM

## 2015-06-21 NOTE — Progress Notes (Signed)
[facility] PROGRESS NOTE    Progress Note   Madisen Ludvigsen NWG:[id number] DOB: December 16, 1925 DOA: 06/18/2015 PCP: No primary care provider on file.   Brief Narrative:   Ann Garrison is an 82 y.o. female 82 y.o. female with a history of hypertension and diabetes mellitus who presents to the hospital for vomiting and diarrhea that started last night shortly after 11 PM. She continued to have at this morning and never presented to the hospital. She initially presented to the hospital in [place]. She was receiving a fluid bolus when she was suddenly noticed to be hypoxic. Further workup revealed bilateral pulmonary infiltrates. It was suspected that she may have pulmonary edema and therefore she was given Lasix however, a BNP which was obtained was found to be normal. It was subsequently suspected that she may have pneumonia. The patient did not admit to any shortness of breath cough fever or chills. She was given IV Levaquin in the [facility] ER but developed redness and itching and therefore this was discontinued.  Assessment/Plan:   Nausea and vomiting and diarrhea likely due to community-acquired pneumonia A  CT scan of the chest without contrast showed multifocal pneumonia. She was started empirically on IV Rocephin and azithromycin on 07-04-15 Started on sepsis pathway, her lactulose was 1.7.  Acute kidney injury: Unknown baseline, her creatinine was 1.6 with worsening to 2.4 question due to Lasix received ER. Multifactorial due to sepsis and ACE inhibitor and hydrochlorothiazide, she was started on aggressive IV fluid hydration her creatinine improved to 1.0.  DM type 2 goal A1C below 7.5: Continue sliding scale insulin hold Amaryl. Good control on sliding scale insulin.  Benign essential HTN Continue to hold ACE inhibitor and HCTZ. DC amlodipine.    DVT Prophylaxis - Lovenox ordered.  Family Communication: none Disposition Plan: Home 3-4 days Code Status:       Code Status Orders        Start     Ordered   06/18/15 1559  Do not attempt resuscitation (DNR)   Continuous    Question Answer Comment  In the event of cardiac or respiratory ARREST Do not call a "code blue"   In the event of cardiac or respiratory ARREST Do not perform Intubation, CPR, defibrillation or ACLS   In the event of cardiac or respiratory ARREST Use medication by any route, position, wound care, and other measures to relive pain and suffering. May use oxygen, suction and manual treatment of airway obstruction as needed for comfort.      06/18/15 1559    Code Status History    Date Active Date Inactive Code Status Order ID Comments User Context   This patient has a current code status but no historical code status.        IV Access:    Peripheral IV   Procedures and diagnostic studies:   Ct Chest Wo Contrast  07-04-2015  CLINICAL DATA:  Short of breath and cough for several days. Lung opacities noted on chest radiography performed yesterday. EXAM: CT CHEST WITHOUT CONTRAST TECHNIQUE: Multidetector CT imaging of the chest was performed following the standard protocol without IV contrast. COMPARISON:  Chest radiographs, 06/19/2015. FINDINGS: Neck base and axilla:  No mass or adenopathy. Mediastinum and hila: Heart top-normal in size. Mild moderate coronary artery calcifications. No mediastinal or hilar masses or pathologically enlarged lymph nodes. Lungs and pleura: Patchy areas of peribronchovascular reticular and airspace opacity are noted in both lungs. Several of the airspace opacities appear as ill-defined nodules.  The largest discrete nodule lies in the right middle lobe adjacent to the minor fissure measuring 6.4 mm. The areas of lung opacity predominate in the lower lungs including both lower lobes, the right middle lobe and left upper lobe lingula. Additional airspace opacity in the dependent lower lobes is likely atelectasis. There is no evidence of pulmonary edema.  No pleural effusion or pneumothorax. Limited upper abdomen: Status post cholecystectomy. Probable left renal sinus cyst. Musculoskeletal: Bones are demineralized. There are disc degenerative changes along the thoracic spine. No osteoblastic or osteolytic lesions. IMPRESSION: 1. Findings are consistent with multifocal pneumonia with multiple bilateral areas of patchy airspace and reticular opacity, with several of the airspace opacities having a nodular configuration. Recommend repeat chest CT following treatment to document resolution. Electronically Signed   By: Amie Portland M.D.   On: 06/20/2015 11:43     Medical Consultants:    None.  Anti-Infectives:   Anti-infectives    Start     Dose/Rate Route Frequency Ordered Stop   06/20/15 1000  cefTRIAXone (ROCEPHIN) 1 g in dextrose 5 % 50 mL IVPB     1 g 100 mL/hr over 30 Minutes Intravenous Every 24 hours 06/20/15 0929 06/27/15 0959   06/20/15 1000  azithromycin (ZITHROMAX) tablet 500 mg     500 mg Oral Every 24 hours 06/20/15 0929 06/27/15 0959   06/18/15 1800  aztreonam (AZACTAM) 1 g in dextrose 5 % 50 mL IVPB  Status:  Discontinued     1 g 100 mL/hr over 30 Minutes Intravenous 3 times per day 06/18/15 1637 06/19/15 1216   06/18/15 1700  azithromycin (ZITHROMAX) 500 mg in dextrose 5 % 250 mL IVPB  Status:  Discontinued     500 mg 250 mL/hr over 60 Minutes Intravenous Every 24 hours 06/18/15 1621 06/19/15 1216      Subjective:    [name] relates she feels much better than yesterday.  Objective:    Filed Vitals:   06/20/15 0920 06/20/15 1336 06/20/15 2143 06/21/15 0545  BP: 150/64 142/65 133/47 148/53  Pulse:  75 69 97  Temp:  98.2 F (36.8 C) 99.4 F (37.4 C) 98.4 F (36.9 C)  TempSrc:  Oral Oral Oral  Resp:  Height:      Weight:      SpO2:  98% 98% 98%    Intake/Output Summary (Last 24 hours) at 06/21/15 0923 Last data filed at 06/21/15 0500  Gross per 24 hour  Intake 523.83 ml  Output   2000 ml  Net  -1476.17 ml   Filed Weights   06/18/15 1457  Weight: 106.096 kg (233 lb 14.4 oz)    Exam: Gen:  NAD Cardiovascular:  RRR. Chest and lungs:   Good air movement, with crackles on the right. Abdomen:  Abdomen soft, NT/ND, + BS Extremities:  No Cedema   Data Reviewed:    Labs: Basic Metabolic Panel:  Recent Labs Lab 06/18/15 1633 06/19/15 0911 06/21/15 0722  NA  --  140 137  K  --  3.7 3.3*  CL  --  99* 99*  CO2  --  29 27  GLUCOSE  --  114* 117*  BUN  --  40* 19  CREATININE 1.64* 2.43* 1.04*  CALCIUM  --  8.6* 8.5*   GFR Estimated Creatinine Clearance: 46.1 mL/min (by C-G formula based on Cr of 1.04). Liver Function Tests: No results for input(s): AST, ALT, ALKPHOS, BILITOT, PROT, ALBUMIN in the last 168 hours. No  results for input(s): LIPASE, AMYLASE in the last 168 hours. No results for input(s): AMMONIA in the last 168 hours. Coagulation profile No results for input(s): INR, PROTIME in the last 168 hours.  CBC:  Recent Labs Lab 06/18/15 1633 06/19/15 0911  WBC 13.8* 13.7*  HGB 13.0 11.6*  HCT 39.6 34.9*  MCV 87.8 87.7  PLT 255 242   Cardiac Enzymes: No results for input(s): CKTOTAL, CKMB, CKMBINDEX, TROPONINI in the last 168 hours. BNP (last 3 results) No results for input(s): PROBNP in the last 8760 hours. CBG:  Recent Labs Lab 06/20/15 0753 06/20/15 1224 06/20/15 1704 06/20/15 2144 06/21/15 0757  GLUCAP 84 138* 173* 109* 119*   D-Dimer: No results for input(s): DDIMER in the last 72 hours. Hgb A1c: No results for input(s): HGBA1C in the last 72 hours. Lipid Profile: No results for input(s): CHOL, HDL, LDLCALC, TRIG, CHOLHDL, LDLDIRECT in the last 72 hours. Thyroid function studies: No results for input(s): TSH, T4TOTAL, T3FREE, THYROIDAB in the last 72 hours.  Invalid input(s): FREET3 Anemia work up: No results for input(s): VITAMINB12, FOLATE, FERRITIN, TIBC, IRON, RETICCTPCT in the last 72 hours. Sepsis Labs:  Recent Labs Lab  06/18/15 1633 06/19/15 0911 06/20/15 0942 06/20/15 1201  WBC 13.8* 13.7*  --   --   LATICACIDVEN  --   --  1.0 1.0   Microbiology Recent Results (from the past 240 hour(s))  Culture, blood (routine x 2) Call MD if unable to obtain prior to antibiotics being given     Status: None (Preliminary result)   Collection Time: 06/20/15  9:42 AM  Result Value Ref Range Status   Specimen Description BLOOD RIGHT ANTECUBITAL  Final   Special Requests IN PEDIATRIC BOTTLE 1CC  Final   Culture PENDING  Incomplete   Report Status PENDING  Incomplete  Culture, blood (routine x 2) Call MD if unable to obtain prior to antibiotics being given     Status: None (Preliminary result)   Collection Time: 06/20/15 12:06 PM  Result Value Ref Range Status   Specimen Description BLOOD LEFT HAND  Final   Special Requests IN PEDIATRIC BOTTLE 3CC  Final   Culture PENDING  Incomplete   Report Status PENDING  Incomplete     Medications:   . aspirin  81 mg Oral Daily  . atorvastatin  20 mg Oral Daily  . azithromycin  500 mg Oral Q24H  . carvedilol  25 mg Oral BID WC  . cefTRIAXone (ROCEPHIN)  IV  1 g Intravenous Q24H  . enoxaparin (LOVENOX) injection  30 mg Subcutaneous Q24H  . insulin aspart  0-5 Units Subcutaneous QHS  . insulin aspart  0-9 Units Subcutaneous TID WC  . loratadine  10 mg Oral Daily  . sodium chloride flush  3 mL Intravenous Q12H   Continuous Infusions: . sodium chloride 10 mL/hr at 06/20/15 0936    Time spent: 25 min   LOS: 3 days   Marinda Elk  [facility] Pager [phone]  *Please refer to [email or web address], password [email or web address] to get updated schedule on who will round on this patient, as hospitalists switch teams weekly. If 7PM-7AM, please contact night-coverage at [email or web address], password [email or web address] for any overnight needs.  06/21/2015, 9:23 AM

## 2015-06-22 ENCOUNTER — Inpatient Hospital Stay (HOSPITAL_COMMUNITY): Payer: Medicare Other

## 2015-06-22 DIAGNOSIS — K529 Noninfective gastroenteritis and colitis, unspecified: Secondary | ICD-10-CM

## 2015-06-22 LAB — BASIC METABOLIC PANEL
Anion gap: 8 (ref 5–15)
BUN: 11 mg/dL (ref 6–20)
CALCIUM: 9 mg/dL (ref 8.9–10.3)
CHLORIDE: 102 mmol/L (ref 101–111)
CO2: 30 mmol/L (ref 22–32)
CREATININE: 0.85 mg/dL (ref 0.44–1.00)
GFR calc non Af Amer: 60 mL/min — ABNORMAL LOW (ref >60)
Glucose, Bld: 127 mg/dL — ABNORMAL HIGH (ref 65–99)
Potassium: 4.2 mmol/L (ref 3.5–5.1)
SODIUM: 140 mmol/L (ref 135–145)

## 2015-06-22 LAB — CBC
HCT: 33.1 % — ABNORMAL LOW (ref 36.0–46.0)
Hemoglobin: 10.6 g/dL — ABNORMAL LOW (ref 12.0–15.0)
MCH: 27.8 pg (ref 26.0–34.0)
MCHC: 32 g/dL (ref 30.0–36.0)
MCV: 86.9 fL (ref 78.0–100.0)
PLATELETS: 254 10*3/uL (ref 150–400)
RBC: 3.81 MIL/uL — ABNORMAL LOW (ref 3.87–5.11)
RDW: 14.8 % (ref 11.5–15.5)
WBC: 9.6 10*3/uL (ref 4.0–10.5)

## 2015-06-22 LAB — GLUCOSE, CAPILLARY
GLUCOSE-CAPILLARY: 211 mg/dL — AB (ref 65–99)
GLUCOSE-CAPILLARY: 135 mg/dL — AB (ref 65–99)
Glucose-Capillary: 122 mg/dL — ABNORMAL HIGH (ref 65–99)
Glucose-Capillary: 157 mg/dL — ABNORMAL HIGH (ref 65–99)

## 2015-06-22 MED ORDER — IPRATROPIUM-ALBUTEROL 0.5-2.5 (3) MG/3ML IN SOLN
3.0000 mL | Freq: Four times a day (QID) | RESPIRATORY_TRACT | Status: DC
  Administered 2015-06-22 – 2015-06-23 (×6): 3 mL via RESPIRATORY_TRACT
  Filled 2015-06-22 (×7): qty 3

## 2015-06-22 MED ORDER — PREDNISONE 20 MG PO TABS
20.0000 mg | ORAL_TABLET | Freq: Every day | ORAL | Status: DC
  Administered 2015-06-22 – 2015-06-24 (×3): 20 mg via ORAL
  Filled 2015-06-22 (×3): qty 1

## 2015-06-22 NOTE — Progress Notes (Signed)
PROGRESS NOTE  Ann Garrison ZOX:[id number] DOB: 01/13/1926 DOA: 06/18/2015 PCP: No primary care provider on file.   HPI: Ann Garrison is a 82 y.o. female with a history of hypertension and diabetes mellitus who presented to the hospital in [place] for vomiting and diarrhea.She was receiving a fluid bolus when she was suddenly noticed to be hypoxic.Workup revealed bilateral pulmonary infiltrates, suspected pulmonary edema so she was given Lasix however, BNP was found to be normal.Due to suspected pneumonia she was given IV Levaquin in the [facility] ER but developed redness and itching and therefore was switched to IV Azactam subsequently transported to [facility] as there were no beds available in [place].  Subjective / 24 H Interval events The patient is doing well today, sitting up in bed. She denies any diarrhea or emesis since Saturday. She has mild conversational dyspnea with audible wheeze but is not in any distress.  Assessment/Plan: Principal Problem:   Gastroenteritis Active Problems:   DM type 2 goal A1C below 7.5   Benign essential HTN   CAP (community acquired pneumonia)   Nausea and vomiting and diarrhea  - Likely gastroenteritis, now resolved, no diarrhea or emesis since Saturday - Electrolytes normal - Tolerating diet  Community-acquired pneumonia - CT scan of the chest without contrast showed multifocal pneumonia. - Started on IV Rocephin and azithromycin on 06/20/2015 - Leukocytosis resolved and no fevers or lactic acidosis, does not appear septic - Has notable wheeze on exam - started Duonebs and prednisone today. No reported history of COPD - Oxygenating well on 2L, will try to wean off O2 when clinically improved.  Chronic diastolic heart failure - Echo checked this admission shows grade 2 diastolic heart failure with preserved EF 60-65% - Clinically compensated, no pulmonary edema on CT chest completed 06/20/2015, no peripheral edema  Acute  kidney injury - Multifactorial - prerenal due to dehydration and Lasix received ER; ACEi and HCTZ - Now resolved after aggressive IV hydration  RML lung nodule 6.43mm - Possibly due to above pneumonia, however should repeat CT chest in 6 months to ensure resolution  DM type 2 goal A1C below 7.5 - Sugars well controlled, 122 this am.  - Continue sliding scale insulin, holding Amaryl.  Benign essential HTN - Continue Coreg, continue to hold lisinopril-HCTZ    Diet: DIET SOFT Room service appropriate?: Yes; Fluid consistency:: Thin Fluids: KVO DVT Prophylaxis: Lovenox  Code Status: DNR Family Communication: None at bedside Disposition Plan: Home with home health PT, likely in 2-3 days Barriers to discharge: Improvement of respiratory status  Consultants:  None  Procedures:  None   Antibiotics Azithromycin 2/4 >> Rocephin 2/4 >>   Studies  Ct Chest Wo Contrast  06/20/2015  CLINICAL DATA:  Short of breath and cough for several days. Lung opacities noted on chest radiography performed yesterday. EXAM: CT CHEST WITHOUT CONTRAST TECHNIQUE: Multidetector CT imaging of the chest was performed following the standard protocol without IV contrast. COMPARISON:  Chest radiographs, 06/19/2015. FINDINGS: Neck base and axilla:  No mass or adenopathy. Mediastinum and hila: Heart top-normal in size. Mild moderate coronary artery calcifications. No mediastinal or hilar masses or pathologically enlarged lymph nodes. Lungs and pleura: Patchy areas of peribronchovascular reticular and airspace opacity are noted in both lungs. Several of the airspace opacities appear as ill-defined nodules. The largest discrete nodule lies in the right middle lobe adjacent to the minor fissure measuring 6.4 mm. The areas of lung opacity predominate in the lower lungs including both lower lobes,  the right middle lobe and left upper lobe lingula. Additional airspace opacity in the dependent lower lobes is likely  atelectasis. There is no evidence of pulmonary edema. No pleural effusion or pneumothorax. Limited upper abdomen: Status post cholecystectomy. Probable left renal sinus cyst. Musculoskeletal: Bones are demineralized. There are disc degenerative changes along the thoracic spine. No osteoblastic or osteolytic lesions. IMPRESSION: 1. Findings are consistent with multifocal pneumonia with multiple bilateral areas of patchy airspace and reticular opacity, with several of the airspace opacities having a nodular configuration. Recommend repeat chest CT following treatment to document resolution. Electronically Signed   By: Amie Portland M.D.   On: 06/20/2015 11:43    Objective  Filed Vitals:   06/21/15 1414 06/21/15 2156 06/22/15 0643 06/22/15 0837  BP: 122/57 158/66 148/64   Pulse: 62 74 77   Temp: 97.7 F (36.5 C) 97.9 F (36.6 C) 98.4 F (36.9 C)   TempSrc: Oral Oral Oral   Resp: Height:      Weight:      SpO2: 100% 95% 99% 98%    Intake/Output Summary (Last 24 hours) at 06/22/15 1056 Last data filed at 06/22/15 0648  Gross per 24 hour  Intake    241 ml  Output    525 ml  Net   -284 ml   Filed Weights   06/18/15 1457  Weight: 106.096 kg (233 lb 14.4 oz)    Exam:  GENERAL: alert, NAD  HEENT: no scleral icterus  NECK: supple  LUNGS: expiratory wheezing noted bilaterally  HEART: RRR without MRG  ABDOMEN: soft, non tender  EXTREMITIES: no clubbing / cyanosis, no peripheral edema  Data Reviewed: Basic Metabolic Panel:  Recent Labs Lab 06/18/15 1633 06/19/15 0911 06/21/15 0722 06/22/15 0638  NA  --  140 137 140  K  --  3.7 3.3* 4.2  CL  --  99* 99* 102  CO2  --  GLUCOSE  --  114* 117* 127*  BUN  --  40* 19 11  CREATININE 1.64* 2.43* 1.04* 0.85  CALCIUM  --  8.6* 8.5* 9.0   CBC:  Recent Labs Lab 06/18/15 1633 06/19/15 0911 06/21/15 1110 06/22/15 0638  WBC 13.8* 13.7* 9.7 9.6  HGB 13.0 11.6* 11.0* 10.6*  HCT 39.6 34.9* 32.9* 33.1*    MCV 87.8 87.7 87.3 86.9  PLT 255 242 226 254   CBG:  Recent Labs Lab 06/21/15 0757 06/21/15 1240 06/21/15 1715 06/21/15 2142 06/22/15 0807  GLUCAP 119* 170* 127* 137* 122*    Recent Results (from the past 240 hour(s))  Culture, blood (routine x 2) Call MD if unable to obtain prior to antibiotics being given     Status: None (Preliminary result)   Collection Time: 06/20/15  9:42 AM  Result Value Ref Range Status   Specimen Description BLOOD RIGHT ANTECUBITAL  Final   Special Requests IN PEDIATRIC BOTTLE 1CC  Final   Culture NO GROWTH 1 DAY  Final   Report Status PENDING  Incomplete  Culture, blood (routine x 2) Call MD if unable to obtain prior to antibiotics being given     Status: None (Preliminary result)   Collection Time: 06/20/15 12:06 PM  Result Value Ref Range Status   Specimen Description BLOOD LEFT HAND  Final   Special Requests IN PEDIATRIC BOTTLE 3CC  Final   Culture NO GROWTH 1 DAY  Final   Report Status PENDING  Incomplete     Scheduled Meds: .  aspirin  81 mg Oral Daily  . atorvastatin  20 mg Oral Daily  . azithromycin  500 mg Oral Q24H  . carvedilol  25 mg Oral BID WC  . cefTRIAXone (ROCEPHIN)  IV  1 g Intravenous Q24H  . enoxaparin (LOVENOX) injection  40 mg Subcutaneous Q24H  . insulin aspart  0-5 Units Subcutaneous QHS  . insulin aspart  0-9 Units Subcutaneous TID WC  . ipratropium-albuterol  3 mL Nebulization Q6H  . loratadine  10 mg Oral Daily  . predniSONE  20 mg Oral Q breakfast  . sodium chloride flush  3 mL Intravenous Q12H   Continuous Infusions: . sodium chloride 10 mL/hr at 06/20/15 0936    Pamella Pert, MD [facility] Pager [phone]. If 7 PM - 7 AM, please contact night-coverage at [email or web address], password [email or web address] 06/22/2015, 10:56 AM  LOS: 4 days

## 2015-06-22 NOTE — Care Management Note (Signed)
Case Management Note  Patient Details  Name: Ann Garrison MRN: [id number] Date of Birth: 1926-04-03  Subjective/Objective:                 Spoke to patient in the room, she declined HH at this time. Lives in [place] with her son who is retired and will provide supervision after discharge. Patient declines needing any DME. Patient states that she has PCP Dr Jimmie Molly in [place].   Action/Plan:  Will continue to follow for [facility] needs.  Expected Discharge Date:                  Expected Discharge Plan:  Home/Self Care  In-House Referral:     Discharge planning Services  CM Consult  Post Acute Care Choice:    Choice offered to:  Patient  DME Arranged:    DME Agency:     HH Arranged:   (Patient declined at this time) [facility] Agency:     Status of Service:  In process, will continue to follow  Medicare Important Message Given:  Yes Date Medicare IM Given:    Medicare IM give by:    Date Additional Medicare IM Given:    Additional Medicare Important Message give by:     If discussed at Long Length of Stay Meetings, dates discussed:    Additional Comments:  Lawerance Sabal, RN 06/22/2015, 3:37 PM

## 2015-06-22 NOTE — Progress Notes (Signed)
Physical Therapy Treatment Patient Details Name: Ann Garrison MRN: [id number] DOB: 05/23/1925 Today's Date: 06/22/2015    History of Present Illness Pt is a 82 y/o F admitted to the hospital for vomiting and diarrhea likely due to community acquired pneumonia.  Pt's PMH includes DM, HTN.     PT Comments    Pt performed increased gait distance 160 ft RW, S/min guard.  Pt performed transfers with min guard.  Pt to d/c home per case manager note.    Follow Up Recommendations  Home health PT;Supervision for mobility/OOB     Equipment Recommendations  None recommended by PT    Recommendations for Other Services       Precautions / Restrictions Precautions Precautions: Fall Precaution Comments: watch O2 Restrictions Weight Bearing Restrictions: No    Mobility  Bed Mobility Overal bed mobility: Needs Assistance Bed Mobility: Supine to Sit     Supine to sit: Min guard;HOB elevated     General bed mobility comments: Use of bed rail w/ HOB elevated.  Increased time, no cues or physical assist needed.  Transfers Overall transfer level: Needs assistance Equipment used: Rolling walker (2 wheeled) Transfers: Sit to/from Stand (from edge of bed and commode.  Pt performed perianal care without assist.  ) Sit to Stand: Min guard         General transfer comment: Hand held assist for first sit>stand w/ instability noted.  Min assist to boost up to standing w/ cues for hand placement and technique using RW.    Ambulation/Gait Ambulation/Gait assistance: Supervision;Min guard Ambulation Distance (Feet): 180 Feet Assistive device: Rolling walker (2 wheeled) Gait Pattern/deviations: Step-through pattern;Decreased step length - left;Decreased step length - right;Staggering right;Staggering left   Gait velocity interpretation: Below normal speed for age/gender General Gait Details: Cues for upright posture, pt shuffles her feet and fatigues quickly, ambulated in room only.      Stairs            Wheelchair Mobility    Modified Rankin (Stroke Patients Only)       Balance Overall balance assessment: Needs assistance Sitting-balance support: Bilateral upper extremity supported Sitting balance-Leahy Scale: Good       Standing balance-Leahy Scale: Good                      Cognition Arousal/Alertness: Awake/alert Behavior During Therapy: WFL for tasks assessed/performed Overall Cognitive Status: Within Functional Limits for tasks assessed                      Exercises      General Comments        Pertinent Vitals/Pain Pain Assessment: No/denies pain    Home Living                      Prior Function            PT Goals (current goals can now be found in the care plan section) Acute Rehab PT Goals Patient Stated Goal: to get stronger Potential to Achieve Goals: Good Progress towards PT goals: Progressing toward goals    Frequency  Min 3X/week    PT Plan      Co-evaluation             End of Session Equipment Utilized During Treatment: Gait belt Activity Tolerance: Patient limited by fatigue Patient left: in chair;with call bell/phone within reach;with chair alarm set     Time: 4098-1191 PT Time Calculation (  min) (ACUTE ONLY): 20 min  Charges:  $Gait Training: 8-22 mins                    G Codes:      Florestine Avers 06-24-15, 5:00 PM  Joycelyn Rua, PTA pager [phone]

## 2015-06-23 LAB — GLUCOSE, CAPILLARY
GLUCOSE-CAPILLARY: 107 mg/dL — AB (ref 65–99)
GLUCOSE-CAPILLARY: 225 mg/dL — AB (ref 65–99)
GLUCOSE-CAPILLARY: 192 mg/dL — AB (ref 65–99)
Glucose-Capillary: 221 mg/dL — ABNORMAL HIGH (ref 65–99)

## 2015-06-23 LAB — BASIC METABOLIC PANEL
Anion gap: 11 (ref 5–15)
BUN: 10 mg/dL (ref 6–20)
CO2: 30 mmol/L (ref 22–32)
Calcium: 9 mg/dL (ref 8.9–10.3)
Chloride: 99 mmol/L — ABNORMAL LOW (ref 101–111)
Creatinine, Ser: 0.82 mg/dL (ref 0.44–1.00)
Glucose, Bld: 122 mg/dL — ABNORMAL HIGH (ref 65–99)
POTASSIUM: 3.8 mmol/L (ref 3.5–5.1)
SODIUM: 140 mmol/L (ref 135–145)

## 2015-06-23 MED ORDER — IPRATROPIUM-ALBUTEROL 0.5-2.5 (3) MG/3ML IN SOLN: 3.0000 mL | Freq: Four times a day (QID) | RESPIRATORY_TRACT | Status: DC | PRN

## 2015-06-23 NOTE — Progress Notes (Signed)
Physical Therapy Treatment Patient Details Name: Ann Garrison MRN: [id number] DOB: [birth year] Today's Date: 06/23/2015    History of Present Illness Pt is a 82 y/o F admitted to the hospital for vomiting and diarrhea likely due to community acquired pneumonia.  Pt's PMH includes DM, HTN.     PT Comments    Pt performed increased gait distance to 300 ft.  Required cues for safety with hand placement during transfers.  Pt will d/c home at discharge.    Follow Up Recommendations  Home health PT;Supervision for mobility/OOB     Equipment Recommendations  None recommended by PT    Recommendations for Other Services       Precautions / Restrictions Precautions Precautions: Fall Restrictions Weight Bearing Restrictions: No    Mobility  Bed Mobility Overal bed mobility: Modified Independent       Supine to sit: Modified independent (Device/Increase time)     General bed mobility comments: HOB remains elevated and pt used rail for support.  Pt demonstrated good technique.    Transfers Overall transfer level: Needs assistance Equipment used: Rolling walker (2 wheeled) Transfers: Sit to/from Stand Sit to Stand: Min guard         General transfer comment: Pt demonstrated poor hand placement.  Remains to pull on RW during sit/stand transfers.  Required cues to push from seated surface and to reach back for seated surface.    Ambulation/Gait Ambulation/Gait assistance: Supervision Ambulation Distance (Feet): 300 Feet Assistive device: Rolling walker (2 wheeled) Gait Pattern/deviations: Step-through pattern;Decreased step length - left;Staggering left;Staggering right;Wide base of support     General Gait Details: Remains to require cues for upright posture and RW positiong during turns and backing. Pt required cues for increasing step length on L.     Stairs            Wheelchair Mobility    Modified Rankin (Stroke Patients Only)       Balance Overall balance  assessment: Needs assistance   Sitting balance-Leahy Scale: Good       Standing balance-Leahy Scale: Good                      Cognition Arousal/Alertness: Awake/alert Behavior During Therapy: WFL for tasks assessed/performed Overall Cognitive Status: Within Functional Limits for tasks assessed                      Exercises      General Comments        Pertinent Vitals/Pain Pain Assessment: No/denies pain    Home Living                      Prior Function            PT Goals (current goals can now be found in the care plan section) Acute Rehab PT Goals Patient Stated Goal: to get stronger Potential to Achieve Goals: Good Progress towards PT goals: Progressing toward goals    Frequency  Min 3X/week    PT Plan      Co-evaluation             End of Session Equipment Utilized During Treatment: Gait belt Activity Tolerance: Patient limited by fatigue Patient left: in chair;with call bell/phone within reach;with chair alarm set     Time: 1036-1106 PT Time Calculation (min) (ACUTE ONLY): 30 min  Charges:  $Gait Training: 8-22 mins $Therapeutic Activity: 8-22 mins  G Codes:      Florestine Avers 06/23/2015, 11:16 AM Joycelyn Rua, PTA pager [phone]

## 2015-06-23 NOTE — Progress Notes (Signed)
PROGRESS NOTE  Bari Leib MWU:[id number] DOB: Aug 12, 1925 DOA: 06/18/2015 PCP: No primary care provider on file.   HPI: Ann Garrison is a 82 y.o. female with a history of hypertension and diabetes mellitus who presented to the hospital in [place] for vomiting and diarrhea.She was receiving a fluid bolus when she was suddenly noticed to be hypoxic.Workup revealed bilateral pulmonary infiltrates, suspected pulmonary edema so she was given Lasix however, BNP was found to be normal.Due to suspected pneumonia she was given IV Levaquin in the [facility] ER but developed redness and itching and therefore was switched to IV Azactam subsequently transported to [facility] as there were no beds available in [place].  Subjective / 24 H Interval events - doing well, denies dyspnea - no chest pain / palpitations  Assessment/Plan: Principal Problem:   Gastroenteritis Active Problems:   DM type 2 goal A1C below 7.5   Benign essential HTN   CAP (community acquired pneumonia)   Nausea and vomiting and diarrhea  - Likely gastroenteritis, now resolved, no diarrhea or emesis since Saturday - Electrolytes normal - Tolerating diet  Community-acquired pneumonia - CT scan of the chest without contrast showed multifocal pneumonia. - Started on IV Rocephin and azithromycin on 06/20/2015 - Leukocytosis resolved and no fevers or lactic acidosis, does not appear septic - Has notable wheeze on exam - started Duonebs and prednisone 2/8. No reported history of COPD - Oxygenating well on 2L, will try to wean off O2 when clinically improved.  - wheezing with slight improvement, continue current management   Chronic diastolic heart failure - Echo checked this admission shows grade 2 diastolic heart failure with preserved EF 60-65% - Clinically compensated, no pulmonary edema on CT chest completed 06/20/2015, no peripheral edema  Acute kidney injury - Multifactorial - prerenal due to dehydration  and Lasix received ER; ACEi and HCTZ - Now resolved after aggressive IV hydration  RML lung nodule 6.73mm - Possibly due to above pneumonia, however should repeat CT chest in 6 months to ensure resolution  DM type 2 goal A1C below 7.5 - Sugars well controlled, 107 this am.  - Continue sliding scale insulin, holding Amaryl.  Benign essential HTN - Continue Coreg, continue to hold lisinopril-HCTZ    Diet: DIET SOFT Room service appropriate?: Yes; Fluid consistency:: Thin Fluids: KVO DVT Prophylaxis: Lovenox  Code Status: DNR Family Communication: None at bedside Disposition Plan: Home with home health PT, anticipate d/c 2/10 if comes off oxygen  Barriers to discharge: Improvement of respiratory status  Consultants:  None  Procedures:  None   Antibiotics Azithromycin 2/4 >> Rocephin 2/4 >>   Studies  Dg Chest Port 1 View  06/22/2015  CLINICAL DATA:  Shortness of breath with exertion EXAM: PORTABLE CHEST 1 VIEW COMPARISON:  06/19/2015 FINDINGS: Cardiac shadow is mildly enlarged but stable in size. Mild vascular congestion is noted. Improved aeration is noted in the left base. Mild persistent patchy changes remain. Degenerative changes of the thoracic spine are noted. IMPRESSION: Improved aeration in the left base. Patchy mild changes remain. Continued follow-up is recommended. Electronically Signed   By: Alcide Clever M.D.   On: 06/22/2015 14:45    Objective  Filed Vitals:   06/23/15 0211 06/23/15 0532 06/23/15 0927 06/23/15 0929  BP:  144/54    Pulse:  63    Temp:  98.4 F (36.9 C)    TempSrc:  Oral    Resp:  18    Height:      Weight:  SpO2: 96% 99% 83% 93%    Intake/Output Summary (Last 24 hours) at 06/23/15 1200 Last data filed at 06/23/15 1038  Gross per 24 hour  Intake    240 ml  Output    500 ml  Net   -260 ml   Filed Weights   06/18/15 1457  Weight: 106.096 kg (233 lb 14.4 oz)    Exam:  GENERAL: alert, NAD  HEENT: no scleral  icterus  NECK: supple  LUNGS: expiratory wheezing noted bilaterally, improving  HEART: RRR without MRG  ABDOMEN: soft, non tender  EXTREMITIES: no clubbing / cyanosis, no peripheral edema  Data Reviewed: Basic Metabolic Panel:  Recent Labs Lab 06/18/15 1633 06/19/15 0911 06/21/15 0722 06/22/15 0638 06/23/15 0724  NA  --  140 137 140 140  K  --  3.7 3.3* 4.2 3.8  CL  --  99* 99* 102 99*  CO2  --  GLUCOSE  --  114* 117* 127* 122*  BUN  --  40* CREATININE 1.64* 2.43* 1.04* 0.85 0.82  CALCIUM  --  8.6* 8.5* 9.0 9.0   CBC:  Recent Labs Lab 06/18/15 1633 06/19/15 0911 06/21/15 1110 06/22/15 0638  WBC 13.8* 13.7* 9.7 9.6  HGB 13.0 11.6* 11.0* 10.6*  HCT 39.6 34.9* 32.9* 33.1*  MCV 87.8 87.7 87.3 86.9  PLT 255 242 226 254   CBG:  Recent Labs Lab 06/22/15 0807 06/22/15 1133 06/22/15 1638 06/22/15 2134 06/23/15 0819  GLUCAP 122* 157* 211* 135* 107*    Recent Results (from the past 240 hour(s))  Culture, blood (routine x 2) Call MD if unable to obtain prior to antibiotics being given     Status: None (Preliminary result)   Collection Time: 06/20/15  9:42 AM  Result Value Ref Range Status   Specimen Description BLOOD RIGHT ANTECUBITAL  Final   Special Requests IN PEDIATRIC BOTTLE 1CC  Final   Culture NO GROWTH 2 DAYS  Final   Report Status PENDING  Incomplete  Culture, blood (routine x 2) Call MD if unable to obtain prior to antibiotics being given     Status: None (Preliminary result)   Collection Time: 06/20/15 12:06 PM  Result Value Ref Range Status   Specimen Description BLOOD LEFT HAND  Final   Special Requests IN PEDIATRIC BOTTLE 3CC  Final   Culture NO GROWTH 2 DAYS  Final   Report Status PENDING  Incomplete     Scheduled Meds: . aspirin  81 mg Oral Daily  . atorvastatin  20 mg Oral Daily  . azithromycin  500 mg Oral Q24H  . carvedilol  25 mg Oral BID WC  . cefTRIAXone (ROCEPHIN)  IV  1 g Intravenous Q24H  . enoxaparin  (LOVENOX) injection  40 mg Subcutaneous Q24H  . insulin aspart  0-5 Units Subcutaneous QHS  . insulin aspart  0-9 Units Subcutaneous TID WC  . ipratropium-albuterol  3 mL Nebulization Q6H  . loratadine  10 mg Oral Daily  . predniSONE  20 mg Oral Q breakfast   Continuous Infusions: . sodium chloride 10 mL/hr at 06/20/15 0936    Pamella Pert, MD [facility] Pager [phone]. If 7 PM - 7 AM, please contact night-coverage at [email or web address], password [facility] 06/23/2015, 12:00 PM  LOS: 5 days

## 2015-06-24 LAB — GLUCOSE, CAPILLARY: GLUCOSE-CAPILLARY: 121 mg/dL — AB (ref 65–99)

## 2015-06-24 MED ORDER — DOXYCYCLINE HYCLATE 100 MG PO TABS: 100.0000 mg | ORAL_TABLET | Freq: Two times a day (BID) | ORAL | Status: AC

## 2015-06-24 MED ORDER — PREDNISONE 20 MG PO TABS: 20.0000 mg | ORAL_TABLET | Freq: Every day | ORAL | Status: DC

## 2015-06-24 NOTE — Care Management Note (Signed)
Case Management Note  Patient Details  Name: Laneya Gasaway MRN: [id number] Date of Birth: 13-Oct-1925       Subjective/Objective: Spoke to patient in the room, she declined HH at this time. Lives in [place] with her son who is retired and will provide supervision after discharge. Patient declines needing any DME. Patient states that she has PCP Dr Jimmie Molly in [place].   Action/Plan:   Patient declined HH PT.      Expected Discharge Date:                  Expected Discharge Plan:  Home/Self Care  In-House Referral:     Discharge planning Services  CM Consult  Post Acute Care Choice:    Choice offered to:  Patient  DME Arranged:    DME Agency:     HH Arranged:   (Patient declined at this time) HH Agency:     Status of Service:  Completed, signed off  Medicare Important Message Given:  Yes Date Medicare IM Given:    Medicare IM give by:    Date Additional Medicare IM Given:    Additional Medicare Important Message give by:     If discussed at Long Length of Stay Meetings, dates discussed:    Additional Comments:  Lawerance Sabal, RN 06/24/2015, 11:07 AM

## 2015-06-24 NOTE — Discharge Summary (Signed)
Physician Discharge Summary  Ann Garrison ZOX:[id number] DOB: 06-01-1925 DOA: 06/18/2015  Admit date: 06/18/2015 Discharge date: 06/24/2015  Time spent: > 30 minutes  Recommendations for Outpatient Follow-up:  1. Follow up with PCP in 1-2 weeks  Discharge Diagnoses:  Principal Problem:   Gastroenteritis Active Problems:   DM type 2 goal A1C below 7.5   Benign essential HTN   CAP (community acquired pneumonia)  Discharge Condition: stable  Diet recommendation: regular  Filed Weights   06/18/15 1457  Weight: 106.096 kg (233 lb 14.4 oz)    History of present illness:  See H&P, Labs, Consult and Test reports for all details in brief, patient is a 82 y.o. female with a history of hypertension and diabetes mellitus who presented to the hospital in [place] for vomiting and diarrhea.She was receiving a fluid bolus when she was suddenly noticed to be hypoxic.Workup revealed bilateral pulmonary infiltrates, suspected pulmonary edema so she was given Lasix however, BNP was found to be normal.Due to suspected pneumonia she was given IV Levaquin in the [facility] ER but developed redness and itching and therefore was switched to IV Azactam subsequently transported to [facility] as there were no beds available in [place].  Hospital Course:  Nausea and vomiting and diarrhea  - Likely gastroenteritis, now resolved, no diarrhea or emesis since Saturday - Electrolytes normal - Tolerating diet Community-acquired pneumonia - CT scan of the chest without contrast showed multifocal pneumonia. - Started on IV Rocephin and azithromycin on 06/20/2015 - Leukocytosis resolved and no fevers or lactic acidosis, improving - Had notable wheeze on exam and was started Duonebs and prednisone 2/8. No reported history of COPD - improving, wheezing resolved, she was weaned off to room air and was able to ambulate well with PT - finish antibiotics and a quick prednisone taper as an  outpatient, discharged home with HHPT in stable condition. Chronic diastolic heart failure - Echo checked this admission shows grade 2 diastolic heart failure with preserved EF 60-65% - Clinically compensated, no pulmonary edema on CT chest completed 06/20/2015, no peripheral edema Acute kidney injury - Multifactorial - prerenal due to dehydration and Lasix received ER; hold ACEi and HCTZ on discharge, defer to PCP to resume in the future if renal function remains stable - Now resolved RML lung nodule 6.49mm - Possibly due to above pneumonia, however should repeat CT chest in 6 months to ensure resolution DM type 2 goal A1C below 7.5 - continue home medications on dsicharge Benign essential HTN - Continue Coreg, continue to hold lisinopril-HCTZ   Procedures:  None    Consultations:  None   Discharge Exam: Filed Vitals:   06/23/15 2029 06/23/15 2126 06/24/15 0609 06/24/15 0845  BP:  153/53 164/66 156/68  Pulse:  60 68 70  Temp:  98.1 F (36.7 C) 98.4 F (36.9 C)   TempSrc:  Oral Oral   Resp:  20 20 20   Height:      Weight:      SpO2: 98% 93% 91%     General: NAD Cardiovascular: RRR Respiratory: CTA biL  Discharge Instructions Activity:  As tolerated   Get Medicines reviewed and adjusted: Please take all your medications with you for your next visit with your Primary MD  Please request your Primary MD to go over all hospital tests and procedure/radiological results at the follow up, please ask your Primary MD to get all Hospital records sent to his/her office.  If you experience worsening of your admission symptoms, develop shortness  of breath, life threatening emergency, suicidal or homicidal thoughts you must seek medical attention immediately by calling 911 or calling your MD immediately if symptoms less severe.  You must read complete instructions/literature along with all the possible adverse reactions/side effects for all the Medicines you take and that have  been prescribed to you. Take any new Medicines after you have completely understood and accpet all the possible adverse reactions/side effects.   Do not drive when taking Pain medications.   Do not take more than prescribed Pain, Sleep and Anxiety Medications  Special Instructions: If you have smoked or chewed Tobacco in the last 2 yrs please stop smoking, stop any regular Alcohol and or any Recreational drug use.  Wear Seat belts while driving.  Please note  You were cared for by a hospitalist during your hospital stay. Once you are discharged, your primary care physician will handle any further medical issues. Please note that NO REFILLS for any discharge medications will be authorized once you are discharged, as it is imperative that you return to your primary care physician (or establish a relationship with a primary care physician if you do not have one) for your aftercare needs so that they can reassess your need for medications and monitor your lab values.    Medication List    STOP taking these medications        lisinopril-hydrochlorothiazide 20-12.5 MG tablet  Commonly known as:  PRINZIDE,ZESTORETIC      TAKE these medications        acetaminophen 650 MG CR tablet  Commonly known as:  TYLENOL  Take 650-1,300 mg by mouth every 8 (eight) hours as needed for pain.     amLODipine 10 MG tablet  Commonly known as:  NORVASC  Take 10 mg by mouth daily.     aspirin EC 81 MG tablet  Take 81 mg by mouth daily.     atorvastatin 20 MG tablet  Commonly known as:  LIPITOR  Take 20 mg by mouth at bedtime.     bisacodyl 5 MG EC tablet  Commonly known as:  DULCOLAX  Take 5 mg by mouth daily as needed for moderate constipation.     CALTRATE 600 PO  Take 600 mg by mouth daily.     carvedilol 25 MG tablet  Commonly known as:  COREG  Take 25 mg by mouth 2 (two) times daily.     CINNAMON PO  Take 1 capsule by mouth daily.     doxycycline 100 MG tablet  Commonly known as:   VIBRA-TABS  Take 1 tablet (100 mg total) by mouth 2 (two) times daily. For 2 days     FISH OIL PO  Take 1 capsule by mouth daily.     GERITOL PO  Take 1 tablet by mouth daily as needed (tired feeling).     glimepiride 4 MG tablet  Commonly known as:  AMARYL  Take 4 mg by mouth daily.     levocetirizine 5 MG tablet  Commonly known as:  XYZAL  Take 5 mg by mouth 2 (two) times daily.     potassium chloride SA 20 MEQ tablet  Commonly known as:  K-DUR,KLOR-CON  Take 20-40 mEq by mouth daily. Take 1 tablet (20 meq) by mouth 1st day, then take 2 tablets (40 meq) 2nd day, then repeat     predniSONE 20 MG tablet  Commonly known as:  DELTASONE  Take 1 tablet (20 mg total) by mouth daily with breakfast. For  3 days     SIMILASAN DRY EYE RELIEF Soln  Place 1 drop into both eyes daily as needed (dry eyes).     VITAMIN D PO  Take 1 tablet by mouth daily.           Follow-up Information    Schedule an appointment as soon as possible for a visit with Valla Leaver, MD.   Specialty:  Family Medicine   Contact information:   [address] [place] [address] [phone]       [facility]   [facility]   Contact information:   [address] [place] [address] [phone]       The results of significant diagnostics from this hospitalization (including imaging, microbiology, ancillary and laboratory) are listed below for reference.    Significant Diagnostic Studies: Dg Chest 2 View  06/19/2015  CLINICAL DATA:  Pulmonary infiltrates; Pt reports fatigue, weakness, emesis and diarrhea x 2 days; she denies CP or SOB EXAM: CHEST  2 VIEW COMPARISON:  None. FINDINGS: Normal cardiac silhouette. There is a airspace opacity in the LEFT lower lobe superior segment. A fine nodular opacities in the RIGHT lower lobe. Upper lungs are clear. IMPRESSION: 1. Opacity in the superior segment of the LEFT lower lobe consistent with  pneumonia versus pulmonary mass. No comparison available. Followup PA and lateral chest X-ray is recommended in 3-4 weeks following trial of antibiotic therapy to ensure resolution and exclude underlying malignancy. 2. Nodular opacities in the RIGHT lower lobe represent infection or neoplasm additionally. Electronically Signed   By: Genevive Bi M.D.   On: 06/19/2015 08:24   Ct Chest Wo Contrast  06/20/2015  CLINICAL DATA:  Short of breath and cough for several days. Lung opacities noted on chest radiography performed yesterday. EXAM: CT CHEST WITHOUT CONTRAST TECHNIQUE: Multidetector CT imaging of the chest was performed following the standard protocol without IV contrast. COMPARISON:  Chest radiographs, 06/19/2015. FINDINGS: Neck base and axilla:  No mass or adenopathy. Mediastinum and hila: Heart top-normal in size. Mild moderate coronary artery calcifications. No mediastinal or hilar masses or pathologically enlarged lymph nodes. Lungs and pleura: Patchy areas of peribronchovascular reticular and airspace opacity are noted in both lungs. Several of the airspace opacities appear as ill-defined nodules. The largest discrete nodule lies in the right middle lobe adjacent to the minor fissure measuring 6.4 mm. The areas of lung opacity predominate in the lower lungs including both lower lobes, the right middle lobe and left upper lobe lingula. Additional airspace opacity in the dependent lower lobes is likely atelectasis. There is no evidence of pulmonary edema. No pleural effusion or pneumothorax. Limited upper abdomen: Status post cholecystectomy. Probable left renal sinus cyst. Musculoskeletal: Bones are demineralized. There are disc degenerative changes along the thoracic spine. No osteoblastic or osteolytic lesions. IMPRESSION: 1. Findings are consistent with multifocal pneumonia with multiple bilateral areas of patchy airspace and reticular opacity, with several of the airspace opacities having a nodular  configuration. Recommend repeat chest CT following treatment to document resolution. Electronically Signed   By: Amie Portland M.D.   On: 06/20/2015 11:43   Dg Chest Port 1 View  06/22/2015  CLINICAL DATA:  Shortness of breath with exertion EXAM: PORTABLE CHEST 1 VIEW COMPARISON:  06/19/2015 FINDINGS: Cardiac shadow is mildly enlarged but stable in size. Mild vascular congestion is noted. Improved aeration is noted in the left base. Mild persistent patchy changes remain. Degenerative changes of the thoracic  spine are noted. IMPRESSION: Improved aeration in the left base. Patchy mild changes remain. Continued follow-up is recommended. Electronically Signed   By: Mark  Lukens M.D.   On: 06/22/2015 14:45    Microbiology: Recent Results (from the past 240 hour(s))  Culture, blood (routine x 2) Call MD if unable to obtain prior to antibiotics being given     Status: None (Preliminary result)   Collection Time: 06/20/15  9:42 AM  Result Value Ref Range Status   Specimen Description BLOOD RIGHT ANTECUBITAL  Final   Special Requests IN PEDIATRIC BOTTLE 1CC  Final   Culture NO GROWTH 4 DAYS  Final   Report Status PENDING  Incomplete  Culture, blood (routine x 2) Call MD if unable to obtain prior to antibiotics being given     Status: None (Preliminary result)   Collection Time: 06/20/15 12:06 PM  Result Value Ref Range Status   Specimen Description BLOOD LEFT HAND  Final   Special Requests IN PEDIATRIC BOTTLE 3CC  Final   Culture NO GROWTH 4 DAYS  Final   Report Status PENDING  Incomplete     Labs: Basic Metabolic Panel:  Recent Labs Lab 06/18/15 1633 06/19/15 0911 06/21/15 0722 06/22/15 0638 06/23/15 0724  NA  --  140 137 140 140  K  --  3.7 3.3* 4.2 3.8  CL  --  99* 99* 102 99*  CO2  --  GLUCOSE  --  114* 117* 127* 122*  BUN  --  40* CREATININE 1.64* 2.43* 1.04* 0.85 0.82  CALCIUM  --  8.6* 8.5* 9.0 9.0   CBC:  Recent Labs Lab 06/18/15 1633 06/19/15 0911  06/21/15 1110 06/22/15 0638  WBC 13.8* 13.7* 9.7 9.6  HGB 13.0 11.6* 11.0* 10.6*  HCT 39.6 34.9* 32.9* 33.1*  MCV 87.8 87.7 87.3 86.9  PLT 255 242 226 254   CBG:  Recent Labs Lab 06/23/15 0819 06/23/15 1216 06/23/15 1646 06/23/15 2124 06/24/15 0753  GLUCAP 107* 225* 221* 192* 121*     Signed:  Conni Knighton  [facility] HospitaliAlcide Clever2017, 3:44 PM

## 2015-06-24 NOTE — Discharge Instructions (Signed)
Follow with PCP in 5-7 days ° °Please get a complete blood count and chemistry panel checked by your Primary MD at your next visit, and again as instructed by your Primary MD. Please get your medications reviewed and adjusted by your Primary MD. ° °Please request your Primary MD to go over all Hospital Tests and Procedure/Radiological results at the follow up, please get all Hospital records sent to your Prim MD by signing hospital release before you go home. ° °If you had Pneumonia of Lung problems at the Hospital: °Please get a 2 view Chest X ray done in 6-8 weeks after hospital discharge or sooner if instructed by your Primary MD. ° °If you have Congestive Heart Failure: °Please call your Cardiologist or Primary MD anytime you have any of the following symptoms:  °1) 3 pound weight gain in 24 hours or 5 pounds in 1 week  °2) shortness of breath, with or without a dry hacking cough  °3) swelling in the hands, feet or stomach  °4) if you have to sleep on extra pillows at night in order to breathe ° °Follow cardiac low salt diet and 1.5 lit/day fluid restriction. ° °If you have diabetes °Accuchecks 4 times/day, Once in AM empty stomach and then before each meal. °Log in all results and show them to your primary doctor at your next visit. °If any glucose reading is under 80 or above 300 call your primary MD immediately. ° °If you have Seizure/Convulsions/Epilepsy: °Please do not drive, operate heavy machinery, participate in activities at heights or participate in high speed sports until you have seen by Primary MD or a Neurologist and advised to do so again. ° °If you had Gastrointestinal Bleeding: °Please ask your Primary MD to check a complete blood count within one week of discharge or at your next visit. Your endoscopic/colonoscopic biopsies that are pending at the time of discharge, will also need to followed by your Primary MD. ° °Get Medicines reviewed and adjusted. °Please take all your medications with you  for your next visit with your Primary MD ° °Please request your Primary MD to go over all hospital tests and procedure/radiological results at the follow up, please ask your Primary MD to get all Hospital records sent to his/her office. ° °If you experience worsening of your admission symptoms, develop shortness of breath, life threatening emergency, suicidal or homicidal thoughts you must seek medical attention immediately by calling 911 or calling your MD immediately  if symptoms less severe. ° °You must read complete instructions/literature along with all the possible adverse reactions/side effects for all the Medicines you take and that have been prescribed to you. Take any new Medicines after you have completely understood and accpet all the possible adverse reactions/side effects.  ° °Do not drive or operate heavy machinery when taking Pain medications.  ° °Do not take more than prescribed Pain, Sleep and Anxiety Medications ° °Special Instructions: If you have smoked or chewed Tobacco  in the last 2 yrs please stop smoking, stop any regular Alcohol  and or any Recreational drug use. ° °Wear Seat belts while driving. ° °Please note °You were cared for by a hospitalist during your hospital stay. If you have any questions about your discharge medications or the care you received while you were in the hospital after you are discharged, you can call the unit and asked to speak with the hospitalist on call if the hospitalist that took care of you is not available. Once you are   discharged, your primary care physician will handle any further medical issues. Please note that NO REFILLS for any discharge medications will be authorized once you are discharged, as it is imperative that you return to your primary care physician (or establish a relationship with a primary care physician if you do not have one) for your aftercare needs so that they can reassess your need for medications and monitor your lab values. ° °You  can reach the hospitalist office at phone [phone] or fax [phone] °  °If you do not have a primary care physician, you can call [phone] for a physician referral. ° °Activity: As tolerated with Full fall precautions use walker/cane & assistance as needed ° °Diet: regular ° °Disposition Home ° ° °

## 2015-06-24 NOTE — Progress Notes (Signed)
Pt given discharge instructions along with her son. Pt given medication prescriptions x2. IV site was discontinued. Pt with no further questions at this time. Pt taken downstairs via wheelchair by nursing staff. Pt to get home health at discharge.

## 2015-06-24 NOTE — Progress Notes (Signed)
Pt went down for her MRI and MRA from ED before brought down to the floor for admission, according to the technician pt wasn't stable therefore the test wasn't done, said will pick up pt later to get it done

## 2015-06-24 NOTE — Care Management Important Message (Signed)
Important Message  Patient Details  Name: Ann Garrison MRN: [id number] Date of Birth: 01-Aug-1925   Medicare Important Message Given:  Yes    Kylene Zamarron Abena 06/24/2015, 11:06 AM

## 2015-06-24 NOTE — Progress Notes (Signed)
PT RN [facility] through [facility] in [place].

## 2015-06-25 LAB — CULTURE, BLOOD (ROUTINE X 2)
CULTURE: NO GROWTH
Culture: NO GROWTH

## 2019-03-05 ENCOUNTER — Inpatient Hospital Stay (HOSPITAL_COMMUNITY)
Admission: EM | Admit: 2019-03-05 | Discharge: 2019-03-07 | DRG: 641 | Disposition: A | Discharge status: Home / Self Care | Payer: Medicare HMO | Attending: Family Medicine | Admitting: Family Medicine

## 2019-03-05 ENCOUNTER — Emergency Department (HOSPITAL_COMMUNITY): Payer: Medicare HMO

## 2019-03-05 ENCOUNTER — Other Ambulatory Visit: Payer: Self-pay

## 2019-03-05 ENCOUNTER — Encounter (HOSPITAL_COMMUNITY): Payer: Self-pay | Admitting: *Deleted

## 2019-03-05 DIAGNOSIS — R531 Weakness: Secondary | ICD-10-CM

## 2019-03-05 DIAGNOSIS — I10 Essential (primary) hypertension: Secondary | ICD-10-CM | POA: Diagnosis present

## 2019-03-05 DIAGNOSIS — Z20828 Contact with and (suspected) exposure to other viral communicable diseases: Secondary | ICD-10-CM | POA: Diagnosis present

## 2019-03-05 DIAGNOSIS — E785 Hyperlipidemia, unspecified: Secondary | ICD-10-CM | POA: Diagnosis present

## 2019-03-05 DIAGNOSIS — E871 Hypo-osmolality and hyponatremia: Principal | ICD-10-CM | POA: Diagnosis present

## 2019-03-05 DIAGNOSIS — Z66 Do not resuscitate: Secondary | ICD-10-CM | POA: Diagnosis present

## 2019-03-05 DIAGNOSIS — N390 Urinary tract infection, site not specified: Secondary | ICD-10-CM | POA: Diagnosis present

## 2019-03-05 DIAGNOSIS — W19XXXA Unspecified fall, initial encounter: Secondary | ICD-10-CM | POA: Diagnosis present

## 2019-03-05 DIAGNOSIS — Z79899 Other long term (current) drug therapy: Secondary | ICD-10-CM

## 2019-03-05 DIAGNOSIS — I951 Orthostatic hypotension: Secondary | ICD-10-CM | POA: Diagnosis present

## 2019-03-05 DIAGNOSIS — Z7984 Long term (current) use of oral hypoglycemic drugs: Secondary | ICD-10-CM

## 2019-03-05 DIAGNOSIS — E860 Dehydration: Secondary | ICD-10-CM | POA: Diagnosis present

## 2019-03-05 DIAGNOSIS — I160 Hypertensive urgency: Secondary | ICD-10-CM | POA: Diagnosis present

## 2019-03-05 DIAGNOSIS — E119 Type 2 diabetes mellitus without complications: Secondary | ICD-10-CM

## 2019-03-05 DIAGNOSIS — I129 Hypertensive chronic kidney disease with stage 1 through stage 4 chronic kidney disease, or unspecified chronic kidney disease: Secondary | ICD-10-CM | POA: Diagnosis present

## 2019-03-05 DIAGNOSIS — T500X5A Adverse effect of mineralocorticoids and their antagonists, initial encounter: Secondary | ICD-10-CM | POA: Diagnosis present

## 2019-03-05 DIAGNOSIS — N1831 Chronic kidney disease, stage 3a: Secondary | ICD-10-CM | POA: Diagnosis present

## 2019-03-05 DIAGNOSIS — E1122 Type 2 diabetes mellitus with diabetic chronic kidney disease: Secondary | ICD-10-CM | POA: Diagnosis present

## 2019-03-05 DIAGNOSIS — Z7982 Long term (current) use of aspirin: Secondary | ICD-10-CM

## 2019-03-05 LAB — BASIC METABOLIC PANEL
Anion gap: 14 (ref 5–15)
BUN: 12 mg/dL (ref 8–23)
CO2: 22 mmol/L (ref 22–32)
Calcium: 9.8 mg/dL (ref 8.9–10.3)
Chloride: 91 mmol/L — ABNORMAL LOW (ref 98–111)
Creatinine, Ser: 0.95 mg/dL (ref 0.44–1.00)
GFR calc Af Amer: >60 mL/min (ref >60)
GFR calc non Af Amer: 52 mL/min — ABNORMAL LOW (ref >60)
Glucose, Bld: 163 mg/dL — ABNORMAL HIGH (ref 70–99)
Potassium: 4.5 mmol/L (ref 3.5–5.1)
Sodium: 127 mmol/L — ABNORMAL LOW (ref 135–145)

## 2019-03-05 LAB — URINALYSIS, ROUTINE W REFLEX MICROSCOPIC
Bilirubin Urine: NEGATIVE
Glucose, UA: NEGATIVE mg/dL
Hgb urine dipstick: NEGATIVE
Ketones, ur: NEGATIVE mg/dL
Nitrite: POSITIVE — AB
Protein, ur: NEGATIVE mg/dL
Specific Gravity, Urine: 1.006 (ref 1.005–1.030)
pH: 7 (ref 5.0–8.0)

## 2019-03-05 LAB — CBC
HCT: 41.4 % (ref 36.0–46.0)
Hemoglobin: 13.9 g/dL (ref 12.0–15.0)
MCH: 29.6 pg (ref 26.0–34.0)
MCHC: 33.6 g/dL (ref 30.0–36.0)
MCV: 88.1 fL (ref 80.0–100.0)
Platelets: 290 10*3/uL (ref 150–400)
RBC: 4.7 MIL/uL (ref 3.87–5.11)
RDW: 13.5 % (ref 11.5–15.5)
WBC: 8.3 10*3/uL (ref 4.0–10.5)
nRBC: 0 % (ref 0.0–0.2)

## 2019-03-05 LAB — CBG MONITORING, ED: Glucose-Capillary: 164 mg/dL — ABNORMAL HIGH (ref 70–99)

## 2019-03-05 LAB — GLUCOSE, CAPILLARY: Glucose-Capillary: 116 mg/dL — ABNORMAL HIGH (ref 70–99)

## 2019-03-05 LAB — LACTIC ACID, PLASMA: Lactic Acid, Venous: 0.8 mmol/L (ref 0.5–1.9)

## 2019-03-05 MED ORDER — SODIUM CHLORIDE 0.9 % IV SOLN
1.0000 g | Freq: Once | INTRAVENOUS | Status: AC
  Administered 2019-03-05: 22:00:00 1 g via INTRAVENOUS
  Filled 2019-03-05: qty 10

## 2019-03-05 MED ORDER — ACETAMINOPHEN 325 MG PO TABS: 650.0000 mg | ORAL_TABLET | Freq: Four times a day (QID) | ORAL | Status: DC | PRN

## 2019-03-05 MED ORDER — ASPIRIN EC 81 MG PO TBEC
81.0000 mg | DELAYED_RELEASE_TABLET | Freq: Every day | ORAL | Status: DC
  Administered 2019-03-06 – 2019-03-07 (×2): 81 mg via ORAL
  Filled 2019-03-05 (×2): qty 1

## 2019-03-05 MED ORDER — AMLODIPINE BESYLATE 5 MG PO TABS
10.0000 mg | ORAL_TABLET | Freq: Once | ORAL | Status: AC
  Administered 2019-03-05: 16:00:00 10 mg via ORAL
  Filled 2019-03-05: qty 2

## 2019-03-05 MED ORDER — ATORVASTATIN CALCIUM 10 MG PO TABS
20.0000 mg | ORAL_TABLET | Freq: Every day | ORAL | Status: DC
  Administered 2019-03-05 – 2019-03-06 (×2): 20 mg via ORAL
  Filled 2019-03-05 (×2): qty 2

## 2019-03-05 MED ORDER — SODIUM CHLORIDE 0.9% FLUSH
3.0000 mL | Freq: Once | INTRAVENOUS | Status: AC
  Administered 2019-03-05: 22:00:00 3 mL via INTRAVENOUS

## 2019-03-05 MED ORDER — ALBUTEROL SULFATE (2.5 MG/3ML) 0.083% IN NEBU: 3.0000 mL | INHALATION_SOLUTION | Freq: Four times a day (QID) | RESPIRATORY_TRACT | Status: DC | PRN

## 2019-03-05 MED ORDER — INSULIN ASPART 100 UNIT/ML ~~LOC~~ SOLN
0.0000 [IU] | Freq: Three times a day (TID) | SUBCUTANEOUS | Status: DC
  Administered 2019-03-06: 1 [IU] via SUBCUTANEOUS

## 2019-03-05 MED ORDER — CARVEDILOL 25 MG PO TABS
25.0000 mg | ORAL_TABLET | Freq: Two times a day (BID) | ORAL | Status: DC
  Administered 2019-03-06 – 2019-03-07 (×3): 25 mg via ORAL
  Filled 2019-03-05 (×3): qty 1

## 2019-03-05 MED ORDER — ONDANSETRON HCL 4 MG/2ML IJ SOLN
4.0000 mg | Freq: Four times a day (QID) | INTRAMUSCULAR | Status: DC | PRN
  Administered 2019-03-06: 4 mg via INTRAVENOUS
  Filled 2019-03-05: qty 2

## 2019-03-05 MED ORDER — ONDANSETRON HCL 4 MG PO TABS: 4.0000 mg | ORAL_TABLET | Freq: Four times a day (QID) | ORAL | Status: DC | PRN

## 2019-03-05 MED ORDER — HYDRALAZINE HCL 20 MG/ML IJ SOLN
10.0000 mg | INTRAMUSCULAR | Status: DC | PRN
  Administered 2019-03-05: 10 mg via INTRAVENOUS
  Filled 2019-03-05: qty 1

## 2019-03-05 MED ORDER — SODIUM CHLORIDE 0.9 % IV BOLUS
1000.0000 mL | Freq: Once | INTRAVENOUS | Status: AC
  Administered 2019-03-05: 21:00:00 1000 mL via INTRAVENOUS

## 2019-03-05 MED ORDER — SODIUM CHLORIDE 0.9 % IV SOLN
1.0000 g | INTRAVENOUS | Status: DC
  Administered 2019-03-06 – 2019-03-07 (×2): 1 g via INTRAVENOUS
  Filled 2019-03-05 (×2): qty 1

## 2019-03-05 MED ORDER — LOSARTAN POTASSIUM 25 MG PO TABS
100.0000 mg | ORAL_TABLET | Freq: Every day | ORAL | Status: DC
  Administered 2019-03-06 – 2019-03-07 (×2): 100 mg via ORAL
  Filled 2019-03-05 (×3): qty 4

## 2019-03-05 MED ORDER — ONDANSETRON HCL 4 MG/2ML IJ SOLN: 4.0000 mg | Freq: Once | INTRAMUSCULAR | Status: DC

## 2019-03-05 MED ORDER — ENOXAPARIN SODIUM 40 MG/0.4ML ~~LOC~~ SOLN
40.0000 mg | SUBCUTANEOUS | Status: DC
  Administered 2019-03-05 – 2019-03-06 (×2): 40 mg via SUBCUTANEOUS
  Filled 2019-03-05 (×2): qty 0.4

## 2019-03-05 MED ORDER — ACETAMINOPHEN 650 MG RE SUPP: 650.0000 mg | Freq: Four times a day (QID) | RECTAL | Status: DC | PRN

## 2019-03-05 MED ORDER — SODIUM CHLORIDE 0.9 % IV SOLN
INTRAVENOUS | Status: AC
  Administered 2019-03-06: via INTRAVENOUS

## 2019-03-05 MED ORDER — ONDANSETRON 4 MG PO TBDP
4.0000 mg | ORAL_TABLET | Freq: Once | ORAL | Status: AC
  Administered 2019-03-05: 17:00:00 4 mg via ORAL
  Filled 2019-03-05: qty 1

## 2019-03-05 NOTE — ED Notes (Signed)
Patient transported to CT 

## 2019-03-05 NOTE — ED Triage Notes (Signed)
Pt states that her BP has been "going up and down for I don't know how long".  Pt states that her BP medication was last adjusted about 2 weeks ago.  No CP or LOC with this.  When I asked if she has problems when her BP goes low she states the her BP "has not been low in years".  Pt states that her highest BP today was 204/102 she is unsure of the lowers BP. Pt is alert and oriented x4

## 2019-03-05 NOTE — ED Provider Notes (Signed)
[facility] Provider Note   CSN: [id number] Arrival date & time: 03/05/19  1302     History   Chief Complaint Chief Complaint  Patient presents with  . Fall  . Blood Pressure Check    HPI Ann Garrison is a 85 y.o. female.      Fall This is a new problem. The current episode started yesterday. The problem occurs rarely. The problem has been gradually improving. Pertinent negatives include no chest pain, no abdominal pain, no headaches and no shortness of breath. Associated symptoms comments: Patient denies pain currently but states that her blood pressure has been fluctuating recently and just recently had a change in her medications.. Exacerbated by: Before meals, after meals, with standing and moving around. The symptoms are relieved by rest. She has tried nothing for the symptoms.  No other exacerbating or alleviating factors identified.  Patient states that the dizziness that she has been having is mild and fluctuating.  Describes it more as a presyncopal sensation then a vertiginous sensation.  Past Medical History:  Diagnosis Date  . Diabetes mellitus without complication (HCC)   . Hypertension     Patient Active Problem List   Diagnosis Date Noted  . Hyponatremia 03/05/2019  . Fall 03/05/2019  . Weakness 03/05/2019  . Type 2 diabetes mellitus with hemoglobin A1c goal of less than 7.5% (HCC) 06/18/2015  . Benign essential HTN 06/18/2015  . Gastroenteritis 06/18/2015  . CAP (community acquired pneumonia) 06/18/2015    Past Surgical History:  Procedure Laterality Date  . CHOLECYSTECTOMY       OB History   No obstetric history on file.      Home Medications    Prior to Admission medications   Medication Sig Start Date End Date Taking? Authorizing Provider  acetaminophen (TYLENOL) 650 MG CR tablet Take 650-1,300 mg by mouth every 8 (eight) hours as needed for pain.   Yes [provider]  albuterol (VENTOLIN HFA)  108 (90 Base) MCG/ACT inhaler Inhale 1 puff into the lungs every 6 (six) hours as needed for wheezing or shortness of breath.   Yes [provider]  aspirin EC 81 MG tablet Take 81 mg by mouth daily.   Yes [provider]  atorvastatin (LIPITOR) 20 MG tablet Take 20 mg by mouth at bedtime.    Yes [provider]  bisacodyl (DULCOLAX) 5 MG EC tablet Take 5 mg by mouth daily as needed for moderate constipation.   Yes [provider]  Calcium Carbonate (CALTRATE 600 PO) Take 600 mg by mouth daily.   Yes [provider]  carvedilol (COREG) 25 MG tablet Take 25 mg by mouth 2 (two) times daily.    Yes [provider]  Cholecalciferol (VITAMIN D PO) Take 1,000 Units by mouth daily.    Yes [provider]  CINNAMON PO Take 1 capsule by mouth daily.   Yes [provider]  glimepiride (AMARYL) 4 MG tablet Take 4 mg by mouth daily.    Yes [provider]  Homeopathic Products [facility](SIMILASAN DRY EYE RELIEF) SOLN Place 1 drop into both eyes daily as needed (dry eyes).   Yes [provider]  levocetirizine (XYZAL) 5 MG tablet Take 5 mg by mouth 2 (two) times daily.   Yes [provider]  losartan (COZAAR) 100 MG tablet Take 100 mg by mouth daily.   Yes [provider]  Multiple Vitamins-Minerals (SENTRY ADULT PO) Take 1 tablet by mouth daily.  Yes [provider]  Omega-3 Fatty Acids (FISH OIL PO) Take 1 capsule by mouth daily.   Yes [provider]  spironolactone (ALDACTONE) 25 MG tablet Take 25-50 mg by mouth 2 (two) times daily. 50 mg in the morning and 25 mg at bedtime   Yes [provider]  doxycycline (VIBRA-TABS) 100 MG tablet Take 1 tablet (100 mg total) by mouth 2 (two) times daily. For 2 days Patient not taking: Reported on 03/05/2019 06/24/15   Leatha Gilding, MD  predniSONE (DELTASONE) 20 MG tablet Take 1 tablet (20 mg total) by mouth daily with breakfast. For 3 days  Patient not taking: Reported on 03/05/2019 06/24/15   Leatha Gilding, MD    Family History Family History  Family history unknown: Yes    Social History Social History   Tobacco Use  . Smoking status: Never Smoker  . Smokeless tobacco: Never Used  Substance Use Topics  . Alcohol use: No  . Drug use: No     Allergies   Levaquin [levofloxacin] and Penicillins   Review of Systems Review of Systems  Constitutional: Positive for fatigue. Negative for chills and fever.  HENT: Negative for ear pain and sore throat.   Eyes: Negative for pain and visual disturbance.  Respiratory: Negative for cough and shortness of breath.   Cardiovascular: Negative for chest pain and palpitations.  Gastrointestinal: Negative for abdominal pain and vomiting.  Genitourinary: Negative for dysuria and hematuria.  Musculoskeletal: Positive for joint swelling (Chronic baseline left knee swelling). Negative for arthralgias and back pain.  Skin: Negative for color change and rash.  Neurological: Positive for weakness (Generalized) and light-headedness. Negative for tremors, seizures, syncope, facial asymmetry, speech difficulty, numbness and headaches.  Psychiatric/Behavioral: Negative for confusion.  All other systems reviewed and are negative.    Physical Exam Updated Vital Signs BP (!) 122/59 (BP Location: Left Arm)   Pulse 61   Temp 98.1 F (36.7 C)   Resp 20   Ht  (1.651 m)   Wt 105.7 kg   SpO2 98%   BMI 38.77 kg/m   Physical Exam Vitals signs and nursing note reviewed.  Constitutional:      General: She is not in acute distress.    Appearance: She is well-developed.  HENT:     Head: Normocephalic and atraumatic.  Eyes:     Conjunctiva/sclera: Conjunctivae normal.  Neck:     Musculoskeletal: Normal range of motion and neck supple.  Cardiovascular:     Rate and Rhythm: Normal rate and regular rhythm.     Heart sounds: No murmur.  Pulmonary:     Effort: Pulmonary effort  is normal. No respiratory distress.     Breath sounds: Normal breath sounds.  Abdominal:     General: There is no distension.     Palpations: Abdomen is soft.     Tenderness: There is no abdominal tenderness. There is no right CVA tenderness or left CVA tenderness.  Musculoskeletal: Normal range of motion.  Skin:    General: Skin is warm and dry.     Capillary Refill: Capillary refill takes less than 2 seconds.  Neurological:     General: No focal deficit present.     Mental Status: She is alert.     Comments: No dysdiadochokinesia.  No abnormal sensation.  Strength 5 out of 5 in all extremities.  Patient is ambulatory in the emergency department without difficulty.  Psychiatric:        Mood and  Affect: Mood normal.      ED Treatments / Results  Labs (all labs ordered are listed, but only abnormal results are displayed) Labs Reviewed  BASIC METABOLIC PANEL - Abnormal; Notable for the following components:      Result Value   Sodium 127 (*)    Chloride 91 (*)    Glucose, Bld 163 (*)    GFR calc non Af Amer 52 (*)    All other components within normal limits  URINALYSIS, ROUTINE W REFLEX MICROSCOPIC - Abnormal; Notable for the following components:   APPearance CLOUDY (*)    Nitrite POSITIVE (*)    Leukocytes,Ua MODERATE (*)    Bacteria, UA MANY (*)    All other components within normal limits  OSMOLALITY, URINE - Abnormal; Notable for the following components:   Osmolality, Ur 177 (*)    All other components within normal limits  GLUCOSE, CAPILLARY - Abnormal; Notable for the following components:   Glucose-Capillary 116 (*)    All other components within normal limits  CBG MONITORING, ED - Abnormal; Notable for the following components:   Glucose-Capillary 164 (*)    All other components within normal limits  CULTURE, BLOOD (ROUTINE X 2)  CULTURE, BLOOD (ROUTINE X 2)  SARS CORONAVIRUS 2 (TAT 6-24 HRS)  CBC  LACTIC ACID, PLASMA  SODIUM, URINE, RANDOM  CBC  BASIC  METABOLIC PANEL  BASIC METABOLIC PANEL  BASIC METABOLIC PANEL  TSH  MAGNESIUM  CORTISOL    EKG EKG Interpretation  Date/Time:  Thursday March 05 2019 13:47:49 EDT Ventricular Rate:  56 PR Interval:  274 QRS Duration: 88 QT Interval:  416 QTC Calculation: 401 R Axis:   65 Text Interpretation:  Sinus bradycardia with 1st degree A-V block Left ventricular hypertrophy with repolarization abnormality ( Sokolow-Lyon , Romhilt-Estes ) Abnormal ECG No previous ECGs available Confirmed by Gareth Morgan [phone]) on 03/05/2019 3:27:00 PM   Radiology Ct Head Wo Contrast  Result Date: 03/05/2019 CLINICAL DATA:  Hypotension EXAM: CT HEAD WITHOUT CONTRAST TECHNIQUE: Contiguous axial images were obtained from the base of the skull through the vertex without intravenous contrast. COMPARISON:  None FINDINGS: Brain: No evidence of acute territorial infarction, hemorrhage, hydrocephalus,extra-axial collection or mass lesion/mass effect. There is dilatation the ventricles and sulci consistent with age-related atrophy. Low-attenuation changes in the deep white matter consistent with small vessel ischemia. Vascular: No hyperdense vessel or unexpected calcification. Dense vascular calcifications are seen throughout. He Skull: The skull is intact. No fracture or focal lesion identified. Sinuses/Orbits: The visualized paranasal sinuses and mastoid air cells are clear. The orbits and globes intact. Other: None IMPRESSION: No acute intracranial abnormality. Findings consistent with age related atrophy and chronic small vessel ischemia Electronically Signed   By: Prudencio Pair M.D.   On: 03/05/2019 19:09    Procedures Procedures (including critical care time)  Medications Ordered in ED Medications  aspirin EC tablet 81 mg (has no administration in time range)  atorvastatin (LIPITOR) tablet 20 mg (20 mg Oral Given 03/05/19 2348)  carvedilol (COREG) tablet 25 mg (has no administration in time range)   losartan (COZAAR) tablet 100 mg (has no administration in time range)  albuterol (PROVENTIL) (2.5 MG/3ML) 0.083% nebulizer solution 3 mL (has no administration in time range)  acetaminophen (TYLENOL) tablet 650 mg (has no administration in time range)    Or  acetaminophen (TYLENOL) suppository 650 mg (has no administration in time range)  ondansetron (ZOFRAN) tablet 4 mg (has no administration in time range)  Or  ondansetron (ZOFRAN) injection 4 mg (has no administration in time range)  insulin aspart (novoLOG) injection 0-9 Units (has no administration in time range)  enoxaparin (LOVENOX) injection 40 mg (40 mg Subcutaneous Given 03/05/19 2347)  0.9 %  sodium chloride infusion ( Intravenous New Bag/Given 03/06/19 0006)  cefTRIAXone (ROCEPHIN) 1 g in sodium chloride 0.9 % 100 mL IVPB (has no administration in time range)  hydrALAZINE (APRESOLINE) injection 10 mg (10 mg Intravenous Given 03/05/19 2346)  sodium chloride flush (NS) 0.9 % injection 3 mL (3 mLs Intravenous Given 03/05/19 2217)  amLODipine (NORVASC) tablet 10 mg (10 mg Oral Given 03/05/19 1543)  ondansetron (ZOFRAN-ODT) disintegrating tablet 4 mg (4 mg Oral Given 03/05/19 1651)  cefTRIAXone (ROCEPHIN) 1 g in sodium chloride 0.9 % 100 mL IVPB (1 g Intravenous Transfusing/Transfer 03/05/19 2218)  sodium chloride 0.9 % bolus 1,000 mL (0 mLs Intravenous Stopping Infusion hung by another clincian 03/06/19 0007)     Initial Impression / Assessment and Plan / ED Course  I have reviewed the triage vital signs and the nursing notes.  Pertinent labs & imaging results that were available during my care of the patient were reviewed by me and considered in my medical decision making (see chart for details).        Patient is a 85 year old female with history and physical exam as above presents to the emergency department for evaluation of fluctuating blood pressure in the setting of a fall that occurred yesterday.  She is  hemodynamically stable at the time of my initial evaluation although hypertensive.  This is likely been going on for quite some time.  Blood pressure in the 180s and 190s.  Patient was given a dose of her home blood pressure medication as well.  Patient was found to be hyponatremic at 127.  This may be contributing to her presyncopal symptoms for patient to be admitted to inpatient service for further work-up and management of this issue.  EKG demonstrated no emergent normalities.  Patient seen in conjunction with the attending physician of record for this patient.  Final Clinical Impressions(s) / ED Diagnoses   Final diagnoses:  None    ED Discharge Orders    None       Jonna Clark, MD 03/06/19 5809    Alvira Monday, MD 03/07/19 1157

## 2019-03-05 NOTE — H&P (Addendum)
History and Physical    Ann MerinoMary Garrison ZOX:[id number] DOB: 05/19/1925 DOA: 03/05/2019  PCP: Valla LeaverEggleston-Clark, [place], MD  Patient coming from: Home.  Chief Complaint: Uncontrolled blood pressure and weakness.  HPI: Ann MerinoMary Garrison is a 85 y.o. female with history of hypertension, diabetes mellitus, hyperlipidemia, diastolic dysfunction per 2D echo done in 2017 presents to the ER after patient was found to have uncontrolled blood pressure and has been feeling weak for last 1 week.  Patient states she had a dizzy spell yesterday and almost had a fall but did not hit her head.  No loss conscious.  Denies any chest pain nausea vomiting or diarrhea.  Report patient was recently started on spironolactone which needs to be confirmed.  Given symptoms of uncontrolled blood pressure weakness and near syncopal episode patient came to ER.  Patient states he also has been having some epigastric discomfort for last few weeks but denies any nausea vomiting or diarrhea.  ED Course: The ER EKG shows sinus bradycardia with heart rate around 56 bpm.  CT head is unremarkable.  Labs show sodium of 127 creatinine 0.9 potassium 4.5 blood glucose 163 WBC 8.3 hemoglobin 13.9 platelets 290 and UA consistent with UTI.  Patient was started on fluids for hyponatremia which may be secondary to being started on spironolactone and also poor appetite.  Since urinalysis was showing signs of UTI ceftriaxone was started.  COVID-19 test was negative.  Review of Systems: As per HPI, rest all negative.   Past Medical History:  Diagnosis Date  . Diabetes mellitus without complication (HCC)   . Hypertension     Past Surgical History:  Procedure Laterality Date  . CHOLECYSTECTOMY       reports that she has never smoked. She has never used smokeless tobacco. She reports that she does not drink alcohol or use drugs.  Allergies  Allergen Reactions  . Levaquin [Levofloxacin] Other (See Comments)    [place] [facility]  [place]  . Penicillins Swelling and Other (See Comments)    Hallucination/ arm swelling Has patient had a PCN reaction causing immediate rash, facial/tongue/throat swelling, SOB or lightheadedness with hypotension: No Has patient had a PCN reaction causing severe rash involving mucus membranes or skin necrosis: No Has patient had a PCN reaction that required hospitalization No Has patient had a PCN reaction occurring within the last 10 years: Yes If all of the above answers are "NO", then may proceed with Cephalosporin use.    Family History  Family history unknown: Yes    Prior to Admission medications   Medication Sig Start Date End Date Taking? Authorizing Provider  acetaminophen (TYLENOL) 650 MG CR tablet Take 650-1,300 mg by mouth every 8 (eight) hours as needed for pain.   Yes [provider]  albuterol (VENTOLIN HFA) 108 (90 Base) MCG/ACT inhaler Inhale 1 puff into the lungs every 6 (six) hours as needed for wheezing or shortness of breath.   Yes [provider]  aspirin EC 81 MG tablet Take 81 mg by mouth daily.   Yes [provider]  atorvastatin (LIPITOR) 20 MG tablet Take 20 mg by mouth at bedtime.    Yes [provider]  bisacodyl (DULCOLAX) 5 MG EC tablet Take 5 mg by mouth daily as needed for moderate constipation.   Yes [provider]  Calcium Carbonate (CALTRATE 600 PO) Take 600 mg by mouth daily.   Yes [provider]  carvedilol (COREG) 25 MG tablet Take 25 mg by mouth  2 (two) times daily.    Yes [provider]  Cholecalciferol (VITAMIN D PO) Take 1,000 Units by mouth daily.    Yes [provider]  CINNAMON PO Take 1 capsule by mouth daily.   Yes [provider]  glimepiride (AMARYL) 4 MG tablet Take 4 mg by mouth daily.    Yes [provider]  Homeopathic Products [employer] DRY EYE RELIEF) SOLN Place 1 drop into both eyes daily as needed (dry eyes).   Yes [provider]   levocetirizine (XYZAL) 5 MG tablet Take 5 mg by mouth 2 (two) times daily.   Yes [provider]  losartan (COZAAR) 100 MG tablet Take 100 mg by mouth daily.   Yes [provider]  Multiple Vitamins-Minerals (SENTRY ADULT PO) Take 1 tablet by mouth daily.   Yes [provider]  Omega-3 Fatty Acids (FISH OIL PO) Take 1 capsule by mouth daily.   Yes [provider]  spironolactone (ALDACTONE) 25 MG tablet Take 25-50 mg by mouth 2 (two) times daily. 50 mg in the morning and 25 mg at bedtime   Yes [provider]  doxycycline (VIBRA-TABS) 100 MG tablet Take 1 tablet (100 mg total) by mouth 2 (two) times daily. For 2 days Patient not taking: Reported on 03/05/2019 06/24/15   Caren Griffins, MD  predniSONE (DELTASONE) 20 MG tablet Take 1 tablet (20 mg total) by mouth daily with breakfast. For 3 days Patient not taking: Reported on 03/05/2019 06/24/15   Caren Griffins, MD    Physical Exam: Constitutional: Moderately built and nourished. Vitals:   03/05/19 1530 03/05/19 1615 03/05/19 1725 03/05/19 1730  BP: (!) 183/83 (!) 191/73 (!) 163/76 (!) 175/67  Pulse: (!) 49 (!) 50 (!) 51 (!) 45  Resp: 17 17 16 15   Temp:      TempSrc:      SpO2: 100% 100% 98% 96%  Weight:       Eyes: Anicteric no pallor. ENMT: No discharge from the ears eyes nose or mouth. Neck: No neck rigidity no mass felt. Respiratory: No rhonchi or crepitations. Cardiovascular: S1-S2 heard. Abdomen: Soft nontender bowel sounds present. Musculoskeletal: No edema.  No joint effusion. Skin: No rash. Neurologic: Alert awake oriented to time place and person.  Moves all extremities. Psychiatric: Appears normal per normal affect.   Labs on Admission: I have personally reviewed following labs and imaging studies  CBC: Recent Labs  Lab 03/05/19 1400  WBC 8.3  HGB 13.9  HCT 41.4  MCV 88.1  PLT 323   Basic Metabolic Panel: Recent Labs  Lab 03/05/19 1400  NA 127*  K 4.5   CL 91*  CO2 22  GLUCOSE 163*  BUN 12  CREATININE 0.95  CALCIUM 9.8   GFR: CrCl cannot be calculated (Unknown ideal weight.). Liver Function Tests: No results for input(s): AST, ALT, ALKPHOS, BILITOT, PROT, ALBUMIN in the last 168 hours. No results for input(s): LIPASE, AMYLASE in the last 168 hours. No results for input(s): AMMONIA in the last 168 hours. Coagulation Profile: No results for input(s): INR, PROTIME in the last 168 hours. Cardiac Enzymes: No results for input(s): CKTOTAL, CKMB, CKMBINDEX, TROPONINI in the last 168 hours. BNP (last 3 results) No results for input(s): PROBNP in the last 8760 hours. HbA1C: No results for input(s): HGBA1C in the last 72 hours. CBG: Recent Labs  Lab 03/05/19 1509  GLUCAP 164*   Lipid Profile: No results for input(s): CHOL, HDL, LDLCALC, TRIG, CHOLHDL, LDLDIRECT in  the last 72 hours. Thyroid Function Tests: No results for input(s): TSH, T4TOTAL, FREET4, T3FREE, THYROIDAB in the last 72 hours. Anemia Panel: No results for input(s): VITAMINB12, FOLATE, FERRITIN, TIBC, IRON, RETICCTPCT in the last 72 hours. Urine analysis:    Component Value Date/Time   COLORURINE YELLOW 03/05/2019 1646   APPEARANCEUR CLOUDY (A) 03/05/2019 1646   LABSPEC 1.006 03/05/2019 1646   PHURINE 7.0 03/05/2019 1646   GLUCOSEU NEGATIVE 03/05/2019 1646   HGBUR NEGATIVE 03/05/2019 1646   BILIRUBINUR NEGATIVE 03/05/2019 1646   KETONESUR NEGATIVE 03/05/2019 1646   PROTEINUR NEGATIVE 03/05/2019 1646   NITRITE POSITIVE (A) 03/05/2019 1646   LEUKOCYTESUR MODERATE (A) 03/05/2019 1646   Sepsis Labs: @LABRCNTIP (procalcitonin:4,lacticidven:4) )No results found for this or any previous visit (from the past 240 hour(s)).   Radiological Exams on Admission: Ct Head Wo Contrast  Result Date: 03/05/2019 CLINICAL DATA:  Hypotension EXAM: CT HEAD WITHOUT CONTRAST TECHNIQUE: Contiguous axial images were obtained from the base of the skull through the vertex without  intravenous contrast. COMPARISON:  None FINDINGS: Brain: No evidence of acute territorial infarction, hemorrhage, hydrocephalus,extra-axial collection or mass lesion/mass effect. There is dilatation the ventricles and sulci consistent with age-related atrophy. Low-attenuation changes in the deep white matter consistent with small vessel ischemia. Vascular: No hyperdense vessel or unexpected calcification. Dense vascular calcifications are seen throughout. He Skull: The skull is intact. No fracture or focal lesion identified. Sinuses/Orbits: The visualized paranasal sinuses and mastoid air cells are clear. The orbits and globes intact. Other: None IMPRESSION: No acute intracranial abnormality. Findings consistent with age related atrophy and chronic small vessel ischemia Electronically Signed   By: 03/07/2019 M.D.   On: 03/05/2019 19:09    EKG: Independently reviewed.  Sinus bradycardia at with heart rate of 56 bpm.  Assessment/Plan Principal Problem:   Hyponatremia Active Problems:   Type 2 diabetes mellitus with hemoglobin A1c goal of less than 7.5% (HCC)   Benign essential HTN   Fall   Weakness    1. Hyponatremia likely could be from recent start of spironolactone which has been held.  Gentle hydration check urine studies cortisol TSH.  Follow metabolic panel. 2. Hypertensive urgency -patient was given amlodipine which we will continue as 5 mg daily and is on Coreg and ARB.  Will hold for Aldactone due to hyponatremia.  As needed IV hydralazine. 3. UTI on ceftriaxone.  Follow urine cultures. 4. Near syncopal episodes will check 2D echo check orthostatics.  Note that patient's EKG does show sinus bradycardia and patient is on a beta-blocker for which I have placed holding orders.  Check TSH.  Physical therapy consult. 5. Epigastric discomfort -on exam abdomen appears benign follow LFTs.  Closely observe. 6. Diabetes mellitus type 2 we will keep patient on sliding scale coverage. 7.  Hyperlipidemia on statins. 8. History of diastolic dysfunction per 2D echo done in 2017.  Appears dehydrated.   DVT prophylaxis: Lovenox. Code Status: DNR. Family Communication: Need to contact family. Disposition Plan: Home. Consults called: Physical therapy. Admission status: Observation.   2018 MD [facility] Pager [phone].  If 7PM-7AM, please contact night-coverage [email or web address] Password [email or web address]  03/05/2019, 10:35 PM

## 2019-03-05 NOTE — ED Triage Notes (Signed)
Once my triage was done pt added that she had fallen yesterday. She states that she fell back but did not strike her head.  No loc.  Pt is alert and oriented but has been having some nausea since her fall.

## 2019-03-05 NOTE — ED Notes (Signed)
Spoke with PA regarding orders.  Near syncope orders placed due to slight dizziness which preceded fall yesterday

## 2019-03-06 ENCOUNTER — Observation Stay (HOSPITAL_BASED_OUTPATIENT_CLINIC_OR_DEPARTMENT_OTHER): Payer: Medicare HMO

## 2019-03-06 DIAGNOSIS — I951 Orthostatic hypotension: Secondary | ICD-10-CM | POA: Diagnosis present

## 2019-03-06 DIAGNOSIS — Z7984 Long term (current) use of oral hypoglycemic drugs: Secondary | ICD-10-CM | POA: Diagnosis not present

## 2019-03-06 DIAGNOSIS — Z7982 Long term (current) use of aspirin: Secondary | ICD-10-CM | POA: Diagnosis not present

## 2019-03-06 DIAGNOSIS — I129 Hypertensive chronic kidney disease with stage 1 through stage 4 chronic kidney disease, or unspecified chronic kidney disease: Secondary | ICD-10-CM | POA: Diagnosis present

## 2019-03-06 DIAGNOSIS — E785 Hyperlipidemia, unspecified: Secondary | ICD-10-CM | POA: Diagnosis present

## 2019-03-06 DIAGNOSIS — E860 Dehydration: Secondary | ICD-10-CM | POA: Diagnosis present

## 2019-03-06 DIAGNOSIS — Z20828 Contact with and (suspected) exposure to other viral communicable diseases: Secondary | ICD-10-CM | POA: Diagnosis present

## 2019-03-06 DIAGNOSIS — Z79899 Other long term (current) drug therapy: Secondary | ICD-10-CM | POA: Diagnosis not present

## 2019-03-06 DIAGNOSIS — T500X5A Adverse effect of mineralocorticoids and their antagonists, initial encounter: Secondary | ICD-10-CM | POA: Diagnosis present

## 2019-03-06 DIAGNOSIS — Z66 Do not resuscitate: Secondary | ICD-10-CM | POA: Diagnosis present

## 2019-03-06 DIAGNOSIS — N390 Urinary tract infection, site not specified: Secondary | ICD-10-CM | POA: Diagnosis present

## 2019-03-06 DIAGNOSIS — R55 Syncope and collapse: Secondary | ICD-10-CM

## 2019-03-06 DIAGNOSIS — I160 Hypertensive urgency: Secondary | ICD-10-CM | POA: Diagnosis present

## 2019-03-06 DIAGNOSIS — N1831 Chronic kidney disease, stage 3a: Secondary | ICD-10-CM | POA: Diagnosis present

## 2019-03-06 DIAGNOSIS — E871 Hypo-osmolality and hyponatremia: Secondary | ICD-10-CM | POA: Diagnosis present

## 2019-03-06 DIAGNOSIS — E1122 Type 2 diabetes mellitus with diabetic chronic kidney disease: Secondary | ICD-10-CM | POA: Diagnosis present

## 2019-03-06 LAB — CBC
HCT: 38.8 % (ref 36.0–46.0)
Hemoglobin: 13.3 g/dL (ref 12.0–15.0)
MCH: 30.2 pg (ref 26.0–34.0)
MCHC: 34.3 g/dL (ref 30.0–36.0)
MCV: 88 fL (ref 80.0–100.0)
Platelets: 274 10*3/uL (ref 150–400)
RBC: 4.41 MIL/uL (ref 3.87–5.11)
RDW: 13.7 % (ref 11.5–15.5)
WBC: 9.9 10*3/uL (ref 4.0–10.5)
nRBC: 0 % (ref 0.0–0.2)

## 2019-03-06 LAB — BASIC METABOLIC PANEL
Anion gap: 10 (ref 5–15)
Anion gap: 10 (ref 5–15)
Anion gap: 9 (ref 5–15)
BUN: 12 mg/dL (ref 8–23)
BUN: 12 mg/dL (ref 8–23)
BUN: 13 mg/dL (ref 8–23)
CO2: 25 mmol/L (ref 22–32)
CO2: 22 mmol/L (ref 22–32)
CO2: 24 mmol/L (ref 22–32)
Calcium: 9.5 mg/dL (ref 8.9–10.3)
Calcium: 9.3 mg/dL (ref 8.9–10.3)
Calcium: 9.1 mg/dL (ref 8.9–10.3)
Chloride: 94 mmol/L — ABNORMAL LOW (ref 98–111)
Chloride: 96 mmol/L — ABNORMAL LOW (ref 98–111)
Chloride: 95 mmol/L — ABNORMAL LOW (ref 98–111)
Creatinine, Ser: 1 mg/dL (ref 0.44–1.00)
Creatinine, Ser: 1.22 mg/dL — ABNORMAL HIGH (ref 0.44–1.00)
Creatinine, Ser: 0.95 mg/dL (ref 0.44–1.00)
GFR calc Af Amer: 57 mL/min — ABNORMAL LOW (ref >60)
GFR calc Af Amer: 45 mL/min — ABNORMAL LOW (ref >60)
GFR calc Af Amer: >60 mL/min (ref >60)
GFR calc non Af Amer: 49 mL/min — ABNORMAL LOW (ref >60)
GFR calc non Af Amer: 39 mL/min — ABNORMAL LOW (ref >60)
GFR calc non Af Amer: 52 mL/min — ABNORMAL LOW (ref >60)
Glucose, Bld: 129 mg/dL — ABNORMAL HIGH (ref 70–99)
Glucose, Bld: 132 mg/dL — ABNORMAL HIGH (ref 70–99)
Glucose, Bld: 141 mg/dL — ABNORMAL HIGH (ref 70–99)
Potassium: 4.1 mmol/L (ref 3.5–5.1)
Potassium: 4.2 mmol/L (ref 3.5–5.1)
Potassium: 4.7 mmol/L (ref 3.5–5.1)
Sodium: 129 mmol/L — ABNORMAL LOW (ref 135–145)
Sodium: 128 mmol/L — ABNORMAL LOW (ref 135–145)
Sodium: 128 mmol/L — ABNORMAL LOW (ref 135–145)

## 2019-03-06 LAB — SARS CORONAVIRUS 2 (TAT 6-24 HRS): SARS Coronavirus 2: NEGATIVE

## 2019-03-06 LAB — TSH: TSH: 1.805 u[IU]/mL (ref 0.350–4.500)

## 2019-03-06 LAB — ECHOCARDIOGRAM COMPLETE
Height: 65 in
Weight: 3728 oz

## 2019-03-06 LAB — OSMOLALITY, URINE: Osmolality, Ur: 177 mOsm/kg — ABNORMAL LOW (ref 300–900)

## 2019-03-06 LAB — MAGNESIUM: Magnesium: 1.7 mg/dL (ref 1.7–2.4)

## 2019-03-06 LAB — GLUCOSE, CAPILLARY
Glucose-Capillary: 126 mg/dL — ABNORMAL HIGH (ref 70–99)
Glucose-Capillary: 150 mg/dL — ABNORMAL HIGH (ref 70–99)
Glucose-Capillary: 117 mg/dL — ABNORMAL HIGH (ref 70–99)
Glucose-Capillary: 132 mg/dL — ABNORMAL HIGH (ref 70–99)

## 2019-03-06 LAB — CORTISOL: Cortisol, Plasma: 16.7 ug/dL

## 2019-03-06 LAB — SODIUM, URINE, RANDOM: Sodium, Ur: 54 mmol/L

## 2019-03-06 MED ORDER — AMLODIPINE BESYLATE 5 MG PO TABS
5.0000 mg | ORAL_TABLET | Freq: Every day | ORAL | Status: DC
  Administered 2019-03-06 – 2019-03-07 (×2): 5 mg via ORAL
  Filled 2019-03-06 (×2): qty 1

## 2019-03-06 NOTE — Progress Notes (Signed)
PROGRESS NOTE    Ann Garrison  ZOX:[id number] DOB: 14-Aug-1925 DOA: 03/05/2019 PCP: Valla Leaver, MD   Brief Narrative:  Per HPI: 85 y.o. female with history of hypertension, diabetes mellitus, hyperlipidemia, diastolic dysfunction per 2D echo done in 2017 presents to the ER after patient was found to have uncontrolled blood pressure and has been feeling weak for last 1 week.  Patient states she had a dizzy spell yesterday and almost had a fall but did not hit her head.  No loss conscious.  Denies any chest pain nausea vomiting or diarrhea.  Report patient was recently started on spironolactone which needs to be confirmed.  Given symptoms of uncontrolled blood pressure weakness and near syncopal episode patient came to ER.  Patient states he also has been having some epigastric discomfort for last few weeks but denies any nausea vomiting or diarrhea.  ED Course: The ER EKG shows sinus bradycardia with heart rate around 56 bpm.  CT head is unremarkable.  Labs show sodium of 127 creatinine 0.9 potassium 4.5 blood glucose 163 WBC 8.3 hemoglobin 13.9 platelets 290 and UA consistent with UTI.  Patient was started on fluids for hyponatremia which may be secondary to being started on spironolactone and also poor appetite.  Since urinalysis was showing signs of UTI ceftriaxone was started.  COVID-19 test was negative.  Patient was admitted on IV ceftriaxone and IV fluids and further work-up. Blood culture no growth so far, COVID-19 negative. Lab work-up with sodium at 129-128.  Subjective: Seen this morning feels better today. About to work with physical therapy Denies chest pain fever or chills.  Assessment & Plan:   UTI POA: UA on admission positive for LE,Nitrite, wbc 21-50.Urine culture not sent unfortunately buy already on iv antibiotics will likely be low yield.  But patient feeling better.  Hyponatremia likely from recent Aldactone and remains on hold.  On IV fluids NSS sodium  slightly up at 128.  TSH cortisol is stable.  Urine sodium 54 and [place] osmol low at 177.cont iv nss and bmp in am  Near syncopal episode, suspect from orthostatic hypotension-was + for OV as below-.Echocardiogram "Left ventricular ejection fraction, by visual estimation, is 60 to 65%. The left ventricle has normal function. Normal left ventricular size. Left ventricular septal wall thickness was mildly increased. Mildly increased left ventricular posterior  wall thickness. There is moderately increased left ventricular hypertrophy.The left ventricular hypertrophy involves basal-septum walls and impaired relaxation."TSH stable.  PT consulted and worked with her no further PT needed- advised supervision at home . Cont iv nss and repeat OV in am. Supine 165/67  Sitting 152/63  Standing 128/70  After ambulation  120/60   HTN Urgency-BP has been running high in 187/90 to 200s,was being managed by her pcp, on losartan 100, coreg 25 and amlodipine 5mg  continue. Bp better here.  Heart rate high 50s to 60s monitor closely while on Coreg.  CKD stage IIIa: baseline creatinine ranges from 0.8-1.0 Other issues are chronic including Diabetes mellitus type 2 on sliding scale insulin here Hyperlipidemia on statin History of diastolic dysfunction-stable.    Body mass index is 38.77 kg/m.    DVT prophylaxis: SCD/lovenox Code Status:  DNR Family Communication: plan of care discussed with patient in detail.udpated daughter Disposition Plan: Patient continues to need IV normal saline given her orthostatic hypotension and hyponatremia and symptomatic with near syncopal episode. Continue IV normal saline AT 75 ML/HR next 24 hours and repeat her orthostatic vitals IN AM. We will  change the patient to inpatient status.   Consultants:  none Procedures: none Microbiology: blood cx- NGTD Antimicrobials: Anti-infectives (From admission, onward)   Start     Dose/Rate Route Frequency Ordered Stop   03/06/19 1500   cefTRIAXone (ROCEPHIN) 1 g in sodium chloride 0.9 % 100 mL IVPB     1 g 200 mL/hr over 30 Minutes Intravenous Every 24 hours 03/05/19 2235     03/05/19 1930  cefTRIAXone (ROCEPHIN) 1 g in sodium chloride 0.9 % 100 mL IVPB     1 g 200 mL/hr over 30 Minutes Intravenous  Once 03/05/19 1922 03/06/19 0000       Objective: Vitals:   03/06/19 0415 03/06/19 0731 03/06/19 0853 03/06/19 1211  BP: 126/63 136/62 120/68   Pulse: 63 (!) 59 65   Resp:      Temp:  97.8 F (36.6 C)    TempSrc:  Oral    SpO2:  100%  96%  Weight:      Height:        Intake/Output Summary (Last 24 hours) at 03/06/2019 1313 Last data filed at 03/06/2019 1100 Gross per 24 hour  Intake 473.18 ml  Output 1000 ml  Net -526.82 ml   Filed Weights   03/05/19 1333  Weight: 105.7 kg   Weight change:   Body mass index is 38.77 kg/m.  Intake/Output from previous day: 10/22 0701 - 10/23 0700 In: 154.5 [I.V.:154.5] Out: 500 [Urine:500] Intake/Output this shift: Total I/O In: 318.7 [P.O.:120; I.V.:198.7] Out: 500 [Urine:500]  Examination:  General exam: Appears calm and comfortable, elderly, frail not in distress HEENT:PERRL,Oral mucosa moist, Ear/Nose normal on gross exam Respiratory system: Bilateral equal air entry, normal vesicular breath sounds, no wheezes or crackles  Cardiovascular system: S1 & S2 heard,No JVD, murmurs. Gastrointestinal system: Abdomen is  soft, non tender, non distended, BS +  Nervous System:Alert and oriented. No focal neurological deficits/moving extremities, sensation intact. Extremities: No edema, no clubbing, distal peripheral pulses palpable. Skin: No rashes, lesions, no icterus MSK: Normal muscle bulk,tone ,power  Medications:  Scheduled Meds: . amLODipine  5 mg Oral Daily  . aspirin EC  81 mg Oral Daily  . atorvastatin  20 mg Oral QHS  . carvedilol  25 mg Oral BID  . enoxaparin (LOVENOX) injection  40 mg Subcutaneous Q24H  . insulin aspart  0-9 Units Subcutaneous TID  WC  . losartan  100 mg Oral Daily   Continuous Infusions: . sodium chloride 50 mL/hr at 03/06/19 0006  . cefTRIAXone (ROCEPHIN)  IV      Data Reviewed: I have personally reviewed following labs and imaging studies  CBC: Recent Labs  Lab 03/05/19 1400 03/06/19 0654  WBC 8.3 9.9  HGB 13.9 13.3  HCT 41.4 38.8  MCV 88.1 88.0  PLT 290 274   Basic Metabolic Panel: Recent Labs  Lab 03/05/19 1400 03/06/19 0133 03/06/19 0654 03/06/19 1052  NA 127* 129* 128* 128*  K 4.5 4.1 4.2 4.7  CL 91* 94* 96* 95*  CO2 22 25 22 24   GLUCOSE 163* 129* 132* 141*  BUN 12 12 12 13   CREATININE 0.95 1.00 1.22* 0.95  CALCIUM 9.8 9.5 9.3 9.1  MG  --  1.7  --   --    GFR: Estimated Creatinine Clearance: 46.6 mL/min (by C-G formula based on SCr of 0.95 mg/dL). Liver Function Tests: No results for input(s): AST, ALT, ALKPHOS, BILITOT, PROT, ALBUMIN in the last 168 hours. No results for input(s): LIPASE, AMYLASE in the  last 168 hours. No results for input(s): AMMONIA in the last 168 hours. Coagulation Profile: No results for input(s): INR, PROTIME in the last 168 hours. Cardiac Enzymes: No results for input(s): CKTOTAL, CKMB, CKMBINDEX, TROPONINI in the last 168 hours. BNP (last 3 results) No results for input(s): PROBNP in the last 8760 hours. HbA1C: No results for input(s): HGBA1C in the last 72 hours. CBG: Recent Labs  Lab 03/05/19 1509 03/05/19 2356 03/06/19 0834 03/06/19 1154  GLUCAP 164* 116* 126* 150*   Lipid Profile: No results for input(s): CHOL, HDL, LDLCALC, TRIG, CHOLHDL, LDLDIRECT in the last 72 hours. Thyroid Function Tests: Recent Labs    03/06/19 0133  TSH 1.805   Anemia Panel: No results for input(s): VITAMINB12, FOLATE, FERRITIN, TIBC, IRON, RETICCTPCT in the last 72 hours. Sepsis Labs: Recent Labs  Lab 03/05/19 2215  LATICACIDVEN 0.8    Recent Results (from the past 240 hour(s))  Blood culture (routine x 2)     Status: None (Preliminary result)    Collection Time: 03/05/19  7:59 PM   Specimen: BLOOD RIGHT HAND  Result Value Ref Range Status   Specimen Description BLOOD RIGHT HAND  Final   Special Requests   Final    BOTTLES DRAWN AEROBIC ONLY Blood Culture results may not be optimal due to an inadequate volume of blood received in culture bottles   Culture   Final    NO GROWTH < 12 HOURS Performed at [facility], [address] [place], [place] [address]    Report Status PENDING  Incomplete  Blood culture (routine x 2)     Status: None (Preliminary result)   Collection Time: 03/05/19  8:15 PM   Specimen: BLOOD LEFT HAND  Result Value Ref Range Status   Specimen Description BLOOD LEFT HAND  Final   Special Requests   Final    BOTTLES DRAWN AEROBIC AND ANAEROBIC Blood Culture results may not be optimal due to an inadequate volume of blood received in culture bottles   Culture   Final    NO GROWTH < 12 HOURS Performed at [facility], [address], [place], [place] [address]    Report Status PENDING  Incomplete  SARS CORONAVIRUS 2 (TAT 6-24 HRS) Nasopharyngeal Nasopharyngeal Swab     Status: None   Collection Time: 03/05/19 11:52 PM   Specimen: Nasopharyngeal Swab  Result Value Ref Range Status   SARS Coronavirus 2 NEGATIVE NEGATIVE Final    Comment: (NOTE) SARS-CoV-2 target nucleic acids are NOT DETECTED. The SARS-CoV-2 RNA is generally detectable in upper and lower respiratory specimens during the acute phase of infection. Negative results do not preclude SARS-CoV-2 infection, do not rule out co-infections with other pathogens, and should not be used as the sole basis for treatment or other patient management decisions. Negative results must be combined with clinical observations, patient history, and epidemiological information. The expected result is Negative. Fact Sheet for Patients: HairSlick.no Fact Sheet for Healthcare Providers:  quierodirigir.com This test is not yet approved or cleared by the [place] FDA and  has been authorized for detection and/or diagnosis of SARS-CoV-2 by FDA under an Emergency Use Authorization (EUA). This EUA will remain  in effect (meaning this test can be used) for the duration of the COVID-19 declaration under Section 56 4(b)(1) of the Act, 21 U.S.C. section 360bbb-3(b)(1), unless the authorization is terminated or revoked sooner. Performed at [facility], [address] [place], [place] [address]  Radiology Studies: Ct Head Wo Contrast  Result Date: 03/05/2019 CLINICAL DATA:  Hypotension EXAM: CT HEAD WITHOUT CONTRAST TECHNIQUE: Contiguous axial images were obtained from the base of the skull through the vertex without intravenous contrast. COMPARISON:  None FINDINGS: Brain: No evidence of acute territorial infarction, hemorrhage, hydrocephalus,extra-axial collection or mass lesion/mass effect. There is dilatation the ventricles and sulci consistent with age-related atrophy. Low-attenuation changes in the deep white matter consistent with small vessel ischemia. Vascular: No hyperdense vessel or unexpected calcification. Dense vascular calcifications are seen throughout. He Skull: The skull is intact. No fracture or focal lesion identified. Sinuses/Orbits: The visualized paranasal sinuses and mastoid air cells are clear. The orbits and globes intact. Other: None IMPRESSION: No acute intracranial abnormality. Findings consistent with age related atrophy and chronic small vessel ischemia Electronically Signed   By: Prudencio Pair M.D.   On: 03/05/2019 19:09      LOS: 0 days   Time spent: More than 50% of that time was spent in counseling and/or coordination of care.  Antonieta Pert, MD [facility]  03/06/2019, 1:13 PM

## 2019-03-06 NOTE — Progress Notes (Signed)
  Echocardiogram 2D Echocardiogram has been performed.  Ann Garrison G Ann Garrison 03/06/2019, 9:59 AM

## 2019-03-06 NOTE — Evaluation (Addendum)
Physical Therapy Evaluation Patient Details Name: Ann Garrison MRN: [id number] DOB: 21-Jan-1926 Today's Date: 03/06/2019   History of Present Illness  85 year old female with history and physical exam as above presents to the emergency department for evaluation of fluctuating blood pressure in the setting of a fall 03/05/19. Admitted for observation of uncontrolled BP and weakness and treament of UTI.   Clinical Impression  PTA pt living alone in single story home with ramped entrance, family available PRN. Pt is independent in ADLs, family shops for her and does cooking and cleaning when she does not feel like it. Although pt only complains of dizziness on initial standing pt does have orthostatic response to positional change. Pt is limited in safe mobility by generalized weakness and decreased balance. Given pt's unsteadiness, PT recommends 24 hour supervision at initial discharge as well as HHPT for strengthening and balance. Pt reports she thinks between her daughter and granddaughter they can stay with her when she goes home. PT will continue to follow acutely.    Orthostatic BPs  Supine 165/67  Sitting 152/63  Standing 128/70  After ambulation  120/60      Follow Up Recommendations Supervision/Assistance - 24 hour;Home health PT    Equipment Recommendations  None recommended by PT       Precautions / Restrictions Precautions Precautions: Fall Precaution Comments: fall prior to admission  Restrictions Weight Bearing Restrictions: No      Mobility  Bed Mobility Overal bed mobility: Needs Assistance Bed Mobility: Supine to Sit;Sit to Supine     Supine to sit: Supervision Sit to supine: Supervision   General bed mobility comments: supervision for safety   Transfers Overall transfer level: Needs assistance Equipment used: Rolling walker (2 wheeled) Transfers: Sit to/from Stand Sit to Stand: Min guard         General transfer comment: min guard for safety, good  power up and steadying, slight dizziness in standing  Ambulation/Gait Ambulation/Gait assistance: Min guard Gait Distance (Feet): 80 Feet Assistive device: Rolling walker (2 wheeled) Gait Pattern/deviations: Step-through pattern;Decreased step length - right;Decreased step length - left;Shuffle;Trunk flexed Gait velocity: slowed Gait velocity interpretation: <1.31 ft/sec, indicative of household ambulator General Gait Details: min guard for safety, slow, slightly unstable gait with no overt LoB      Balance Overall balance assessment: Needs assistance Sitting-balance support: No upper extremity supported;Feet supported Sitting balance-Leahy Scale: Good     Standing balance support: Bilateral upper extremity supported;Single extremity supported Standing balance-Leahy Scale: Poor Standing balance comment: requires at least single UE support for balance                             Pertinent Vitals/Pain Pain Assessment: No/denies pain    Home Living Family/patient expects to be discharged to:: Private residence Living Arrangements: Children Available Help at Discharge: Family;Available PRN/intermittently(possibly 24 hrs at d/c ) Type of Home: House Home Access: Ramped entrance     Home Layout: One level Home Equipment: Walker - 2 wheels;Walker - 4 wheels;Cane - single point;Toilet riser;Wheelchair - manual      Prior Function Level of Independence: Needs assistance   Gait / Transfers Assistance Needed: ambulates household distances with cane and furniture, rarely goes out  ADL's / Homemaking Assistance Needed: independent in ADLs, assist for iADLs           Extremity/Trunk Assessment   Upper Extremity Assessment Upper Extremity Assessment: Generalized weakness    Lower Extremity Assessment Lower Extremity  Assessment: Generalized weakness    Cervical / Trunk Assessment Cervical / Trunk Assessment: Kyphotic  Communication   Communication: HOH   Cognition Arousal/Alertness: Awake/alert Behavior During Therapy: WFL for tasks assessed/performed Overall Cognitive Status: Within Functional Limits for tasks assessed                                        General Comments General comments (skin integrity, edema, etc.): BP orthostatic, no complaints of dizziness HR max with ambulation 115bpm         Assessment/Plan    PT Assessment Patient needs continued PT services  PT Problem List Decreased mobility;Cardiopulmonary status limiting activity       PT Treatment Interventions DME instruction;Gait training;Functional mobility training;Therapeutic activities;Therapeutic exercise;Patient/family education;Balance training    PT Goals (Current goals can be found in the Care Plan section)  Acute Rehab PT Goals Patient Stated Goal: go home PT Goal Formulation: With patient Time For Goal Achievement: 03/20/19 Potential to Achieve Goals: Good    Frequency Min 3X/week    AM-PAC PT "6 Clicks" Mobility  Outcome Measure Help needed turning from your back to your side while in a flat bed without using bedrails?: None Help needed moving from lying on your back to sitting on the side of a flat bed without using bedrails?: None Help needed moving to and from a bed to a chair (including a wheelchair)?: None Help needed standing up from a chair using your arms (e.g., wheelchair or bedside chair)?: None Help needed to walk in hospital room?: None Help needed climbing 3-5 steps with a railing? : A Little 6 Click Score: 23    End of Session Equipment Utilized During Treatment: Gait belt Activity Tolerance: Patient tolerated treatment well Patient left: in bed;with call bell/phone within reach;with bed alarm set Nurse Communication: Mobility status PT Visit Diagnosis: Muscle weakness (generalized) (M62.81);Difficulty in walking, not elsewhere classified (R26.2);Dizziness and giddiness (R42);History of falling  (Z91.81);Unsteadiness on feet (R26.81)    Time: 0762-2633 PT Time Calculation (min) (ACUTE ONLY): 30 min   Charges:   PT Evaluation $PT Eval Moderate Complexity: 1 Mod          Ann Garrison PT, DPT Acute Rehabilitation Services Pager [phone] Office [phone]   Ann Garrison 03/06/2019, 11:19 AM

## 2019-03-07 LAB — BASIC METABOLIC PANEL
Anion gap: 10 (ref 5–15)
Anion gap: 9 (ref 5–15)
BUN: 13 mg/dL (ref 8–23)
BUN: 12 mg/dL (ref 8–23)
CO2: 22 mmol/L (ref 22–32)
CO2: 24 mmol/L (ref 22–32)
Calcium: 8.7 mg/dL — ABNORMAL LOW (ref 8.9–10.3)
Calcium: 8.9 mg/dL (ref 8.9–10.3)
Chloride: 97 mmol/L — ABNORMAL LOW (ref 98–111)
Chloride: 96 mmol/L — ABNORMAL LOW (ref 98–111)
Creatinine, Ser: 1.02 mg/dL — ABNORMAL HIGH (ref 0.44–1.00)
Creatinine, Ser: 0.93 mg/dL (ref 0.44–1.00)
GFR calc Af Amer: 56 mL/min — ABNORMAL LOW (ref >60)
GFR calc Af Amer: >60 mL/min (ref >60)
GFR calc non Af Amer: 48 mL/min — ABNORMAL LOW (ref >60)
GFR calc non Af Amer: 54 mL/min — ABNORMAL LOW (ref >60)
Glucose, Bld: 117 mg/dL — ABNORMAL HIGH (ref 70–99)
Glucose, Bld: 149 mg/dL — ABNORMAL HIGH (ref 70–99)
Potassium: 4.5 mmol/L (ref 3.5–5.1)
Potassium: 4.8 mmol/L (ref 3.5–5.1)
Sodium: 129 mmol/L — ABNORMAL LOW (ref 135–145)
Sodium: 129 mmol/L — ABNORMAL LOW (ref 135–145)

## 2019-03-07 LAB — GLUCOSE, CAPILLARY
Glucose-Capillary: 110 mg/dL — ABNORMAL HIGH (ref 70–99)
Glucose-Capillary: 128 mg/dL — ABNORMAL HIGH (ref 70–99)
Glucose-Capillary: 141 mg/dL — ABNORMAL HIGH (ref 70–99)

## 2019-03-07 MED ORDER — SODIUM CHLORIDE 1 G PO TABS: 1.0000 g | ORAL_TABLET | Freq: Three times a day (TID) | ORAL | 0 refills | Status: AC

## 2019-03-07 MED ORDER — CEPHALEXIN 500 MG PO CAPS: 500.0000 mg | ORAL_CAPSULE | Freq: Three times a day (TID) | ORAL | 0 refills | Status: AC

## 2019-03-07 MED ORDER — SODIUM CHLORIDE 0.9 % IV SOLN
INTRAVENOUS | Status: AC
  Administered 2019-03-07: 13:00:00 via INTRAVENOUS

## 2019-03-07 NOTE — Discharge Summary (Signed)
Discharge Summary  Ann Garrison WUJ:[id number] DOB: [birth year]  PCP: Cathie Olden, MD  Admit date: 03/05/2019 Discharge date: 03/07/2019  Time spent: 30 minutes  Recommendations for Outpatient Follow-up:  Primary care provider follow-up with primary care doctor to recheck her sodium level in 2 to 3 days 1.   Discharge Diagnoses:  Active Hospital Problems   Diagnosis Date Noted   Hyponatremia 03/05/2019   Orthostatic hypotension 03/06/2019   Fall 03/05/2019   Weakness 03/05/2019   Benign essential HTN 06/18/2015   Type 2 diabetes mellitus with hemoglobin A1c goal of less than 7.5% (HCC) 06/18/2015    Resolved Hospital Problems  No resolved problems to display.    Discharge Condition: Improved  Diet recommendation: Cardiac  Vitals:   03/07/19 0751 03/07/19 1622  BP: 131/65 (!) 165/72  Pulse: (!) 55 (!) 57  Resp: 20 20  Temp: 98.3 F (36.8 C) 98.3 F (36.8 C)  SpO2: 97% 97%    History of present illness:   Per HPI: 85 y.o.femalewithhistory of hypertension, diabetes mellitus, hyperlipidemia, diastolic dysfunction per 2D echo done in 2017 presents to the ER after patient was found to have uncontrolled blood pressure and has been feeling weak for last 1 week. Patient states she had a dizzy spell yesterday and almost had a fall but did not hit her head. No loss conscious. Denies any chest pain nausea vomiting or diarrhea. Report patient was recently started on spironolactone which needs to be confirmed. Given symptoms of uncontrolled blood pressure weakness and near syncopal episode patient came to ER. Patient states he also has been having some epigastric discomfortfor last few weeks but denies any nausea vomiting or diarrhea.  Hospital Course:  Principal Problem:   Hyponatremia Active Problems:   Type 2 diabetes mellitus with hemoglobin A1c goal of less than 7.5% (HCC)   Benign essential HTN   Fall   Weakness   Orthostatic  hypotension Patient's blood sodium was still low.  She received several bolus of IV fluid got better but still at 129 initially was 128.  Patient insisted on being discharged she agreed to increase salt also prescribed her salt tablet and she is to follow-up with primary care doctor to recheck her sodium level in 2 to 3 days  1. Hyponatremia likely could be from recent start of spironolactone which has been held.  Gentle hydration check urine studies cortisol TSH.  Follow metabolic panel. 2. Hypertensive urgency -patient was given amlodipine which we will continue as 5 mg daily and is on Coreg and ARB.  Will hold for Aldactone due to hyponatremia.  As needed IV hydralazine. 3. UTI on ceftriaxone.  Follow urine cultures. 4. Near syncopal episodes will check 2D echo check orthostatics.  Note that patient's EKG does show sinus bradycardia and patient is on a beta-blocker for which I have placed holding orders.  Check TSH.  Physical therapy consult. 5. Epigastric discomfort -on exam abdomen appears benign follow LFTs.  Closely observe. 6. Diabetes mellitus type 2 we will keep patient on sliding scale coverage. 7. Hyperlipidemia on statins. 8. History of diastolic dysfunction per 2D echo done in 2017.  Appears dehydrated Procedures:  None  Consultations:  None  Discharge Exam: BP (!) 165/72 (BP Location: Left Arm)    Pulse (!) 57    Temp 98.3 F (36.8 C) (Oral)    Resp 20    Ht 5\' 5"  (1.651 m)    Wt 105.7 kg    SpO2 97%    BMI 38.77 kg/m  General: Pleasant alert oriented x3 Cardiovascular: The rate rhythm no murmur Respiratory: Effort is normal no rales or rhonchi  Discharge Instructions You were cared for by a hospitalist during your hospital stay. If you have any questions about your discharge medications or the care you received while you were in the hospital after you are discharged, you can call the unit and asked to speak with the hospitalist on call if the hospitalist that took care  of you is not available. Once you are discharged, your primary care physician will handle any further medical issues. Please note that NO REFILLS for any discharge medications will be authorized once you are discharged, as it is imperative that you return to your primary care physician (or establish a relationship with a primary care physician if you do not have one) for your aftercare needs so that they can reassess your need for medications and monitor your lab values.  Discharge Instructions    Call MD for:  persistant dizziness or light-headedness   Complete by: As directed    Call MD for:  temperature >100.4   Complete by: As directed    Diet - low sodium heart healthy   Complete by: As directed    Discharge instructions   Complete by: As directed    Complete antibiotics for 7 days.  See primary care provider on Monday to recheck CBC and sodium patient does not need to take extra dietary sodium if she is able to obtain the salt tablets otherwise increase salt intake by adding extra salt to food for the next couple of days   Increase activity slowly   Complete by: As directed      Allergies as of 03/07/2019      Reactions   Levaquin [levofloxacin] Other (See Comments)   [place] [facility] 06[place]18/[place]   Penicillins Swelling, Other (See Comments)   Hallucination/ arm swelling Has patient had a PCN reaction causing immediate rash, facial/tongue/throat swelling, SOB or lightheadedness with hypotension: No Has patient had a PCN reaction causing severe rash involving mucus membranes or skin necrosis: No Has patient had a PCN reaction that required hospitalization No Has patient had a PCN reaction occurring within the last 10 years: Yes If all of the above answers are "NO", then may proceed with Cephalosporin use.      Medication List    STOP taking these medications   predniSONE 20 MG tablet Commonly known as: DELTASONE     TAKE these medications   acetaminophen 650 MG  CR tablet Commonly known as: TYLENOL Take 650-1,300 mg by mouth every 8 (eight) hours as needed for pain.   albuterol 108 (90 Base) MCG/ACT inhaler Commonly known as: VENTOLIN HFA Inhale 1 puff into the lungs every 6 (six) hours as needed for wheezing or shortness of breath.   aspirin EC 81 MG tablet Take 81 mg by mouth daily.   atorvastatin 20 MG tablet Commonly known as: LIPITOR Take 20 mg by mouth at bedtime.   bisacodyl 5 MG EC tablet Commonly known as: DULCOLAX Take 5 mg by mouth daily as needed for moderate constipation.   CALTRATE 600 PO Take 600 mg by mouth daily.   carvedilol 25 MG tablet Commonly known as: COREG Take 25 mg by mouth 2 (two) times daily.   cephALEXin 500 MG capsule Commonly known as: KEFLEX Take 1 capsule (500 mg total) by mouth 3 (three) times daily for 7 days.   CINNAMON PO Take 1 capsule by mouth  daily.   doxycycline 100 MG tablet Commonly known as: VIBRA-TABS Take 1 tablet (100 mg total) by mouth 2 (two) times daily. For 2 days   FISH OIL PO Take 1 capsule by mouth daily.   glimepiride 4 MG tablet Commonly known as: AMARYL Take 4 mg by mouth daily.   levocetirizine 5 MG tablet Commonly known as: XYZAL Take 5 mg by mouth 2 (two) times daily.   losartan 100 MG tablet Commonly known as: COZAAR Take 100 mg by mouth daily.   SENTRY ADULT PO Take 1 tablet by mouth daily.   Similasan Dry Eye Relief Soln Place 1 drop into both eyes daily as needed (dry eyes).   sodium chloride 1 g tablet Take 1 tablet (1 g total) by mouth 3 (three) times daily with meals.   spironolactone 25 MG tablet Commonly known as: ALDACTONE Take 25-50 mg by mouth 2 (two) times daily. 50 mg in the morning and 25 mg at bedtime   VITAMIN D PO Take 1,000 Units by mouth daily.      Allergies  Allergen Reactions   Levaquin [Levofloxacin] Other (See Comments)    Veins redden per [facility] [facility] 06/18/[place]   Penicillins Swelling and Other (See Comments)     Hallucination/ arm swelling Has patient had a PCN reaction causing immediate rash, facial/tongue/throat swelling, SOB or lightheadedness with hypotension: No Has patient had a PCN reaction causing severe rash involving mucus membranes or skin necrosis: No Has patient had a PCN reaction that required hospitalization No Has patient had a PCN reaction occurring within the last 10 years: Yes If all of the above answers are "NO", then may proceed with Cephalosporin use.      The results of significant diagnostics from this hospitalization (including imaging, microbiology, ancillary and laboratory) are listed below for reference.    Significant Diagnostic Studies: Ct Head Wo Contrast  Result Date: 03/05/2019 CLINICAL DATA:  Hypotension EXAM: CT HEAD WITHOUT CONTRAST TECHNIQUE: Contiguous axial images were obtained from the base of the skull through the vertex without intravenous contrast. COMPARISON:  None FINDINGS: Brain: No evidence of acute territorial infarction, hemorrhage, hydrocephalus,extra-axial collection or mass lesion/mass effect. There is dilatation the ventricles and sulci consistent with age-related atrophy. Low-attenuation changes in the deep white matter consistent with small vessel ischemia. Vascular: No hyperdense vessel or unexpected calcification. Dense vascular calcifications are seen throughout. He Skull: The skull is intact. No fracture or focal lesion identified. Sinuses/Orbits: The visualized paranasal sinuses and mastoid air cells are clear. The orbits and globes intact. Other: None IMPRESSION: No acute intracranial abnormality. Findings consistent with age related atrophy and chronic small vessel ischemia Electronically Signed   By: Jonna Clark M.D.   On: 03/05/2019 19:09    Microbiology: Recent Results (from the past 240 hour(s))  Blood culture (routine x 2)     Status: None (Preliminary result)   Collection Time: 03/05/19  7:59 PM   Specimen: BLOOD RIGHT HAND  Result  Value Ref Range Status   Specimen Description BLOOD RIGHT HAND  Final   Special Requests   Final    BOTTLES DRAWN AEROBIC ONLY Blood Culture results may not be optimal due to an inadequate volume of blood received in culture bottles   Culture   Final    NO GROWTH 2 DAYS Performed at [facility], [address] [place], [place] [address]    Report Status PENDING  Incomplete  Blood culture (routine x 2)     Status:  None (Preliminary result)   Collection Time: 03/05/19  8:15 PM   Specimen: BLOOD LEFT HAND  Result Value Ref Range Status   Specimen Description BLOOD LEFT HAND  Final   Special Requests   Final    BOTTLES DRAWN AEROBIC AND ANAEROBIC Blood Culture results may not be optimal due to an inadequate volume of blood received in culture bottles   Culture   Final    NO GROWTH 2 DAYS Performed at [facility], [address], [place], [place] [address]    Report Status PENDING  Incomplete  SARS CORONAVIRUS 2 (TAT 6-24 HRS) Nasopharyngeal Nasopharyngeal Swab     Status: None   Collection Time: 03/05/19 11:52 PM   Specimen: Nasopharyngeal Swab  Result Value Ref Range Status   SARS Coronavirus 2 NEGATIVE NEGATIVE Final    Comment: (NOTE) SARS-CoV-2 target nucleic acids are NOT DETECTED. The SARS-CoV-2 RNA is generally detectable in upper and lower respiratory specimens during the acute phase of infection. Negative results do not preclude SARS-CoV-2 infection, do not rule out co-infections with other pathogens, and should not be used as the sole basis for treatment or other patient management decisions. Negative results must be combined with clinical observations, patient history, and epidemiological information. The expected result is Negative. Fact Sheet for Patients: HairSlick.no Fact Sheet for Healthcare Providers: quierodirigir.com This test is not yet approved or cleared by the [place] FDA and  has  been authorized for detection and/or diagnosis of SARS-CoV-2 by FDA under an Emergency Use Authorization (EUA). This EUA will remain  in effect (meaning this test can be used) for the duration of the COVID-19 declaration under Section 56 4(b)(1) of the Act, 21 U.S.C. section 360bbb-3(b)(1), unless the authorization is terminated or revoked sooner. Performed at [facility], [address] [place], [place] [address]      Labs: Basic Metabolic Panel: Recent Labs  Lab 03/06/19 0133 03/06/19 0654 03/06/19 1052 03/07/19 0423 03/07/19 1600  NA 129* 128* 128* 129* 129*  K 4.1 4.2 4.7 4.5 4.8  CL 94* 96* 95* 97* 96*  CO2 GLUCOSE 129* 132* 141* 117* 149*  BUN CREATININE 1.00 1.22* 0.95 1.02* 0.93  CALCIUM 9.5 9.3 9.1 8.7* 8.9  MG 1.7  --   --   --   --    Liver Function Tests: No results for input(s): AST, ALT, ALKPHOS, BILITOT, PROT, ALBUMIN in the last 168 hours. No results for input(s): LIPASE, AMYLASE in the last 168 hours. No results for input(s): AMMONIA in the last 168 hours. CBC: Recent Labs  Lab 03/05/19 1400 03/06/19 0654  WBC 8.3 9.9  HGB 13.9 13.3  HCT 41.4 38.8  MCV 88.1 88.0  PLT 290 274   Cardiac Enzymes: No results for input(s): CKTOTAL, CKMB, CKMBINDEX, TROPONINI in the last 168 hours. BNP: BNP (last 3 results) No results for input(s): BNP in the last 8760 hours.  ProBNP (last 3 results) No results for input(s): PROBNP in the last 8760 hours.  CBG: Recent Labs  Lab 03/06/19 1748 03/06/19 2104 03/07/19 0752 03/07/19 1134 03/07/19 1624  GLUCAP 117* 132* 110* 128* 141*       Signed:  Myrtie Neither, MD [facility] 03/07/2019, 5:39 PM

## 2019-03-10 LAB — CULTURE, BLOOD (ROUTINE X 2)
Culture: NO GROWTH
Culture: NO GROWTH

## 2021-05-30 IMAGING — CT CT HEAD W/O CM
3 series · 15 of 47 positions shown, 18 images · non-contrast
Comparison: None

CLINICAL DATA: Hypotension

EXAM:
CT HEAD WITHOUT CONTRAST
TECHNIQUE: Contiguous axial images were obtained from the base of the skull
through the vertex without intravenous contrast.

[Series 2: head 5.0 h30s · axial · 0.46mm/px · z∈[-125,+15]mm · 9 of 34 slices shown, 12 images]
[im 3/34  brain]
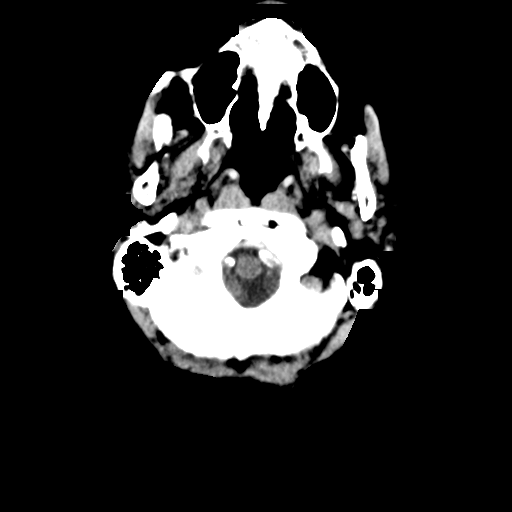
[im 3/34  bone]
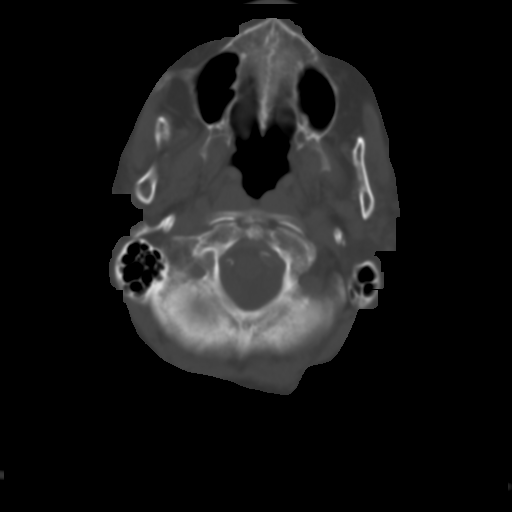
[im 6/34  brain]
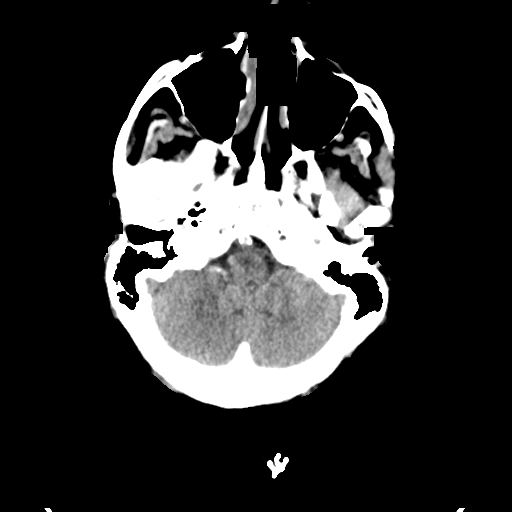
[im 10/34  brain]
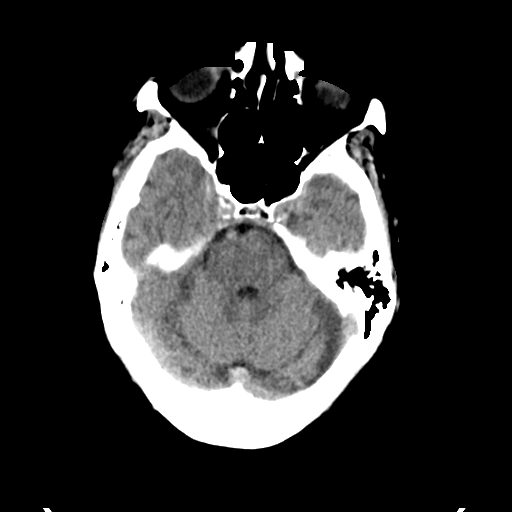
[im 13/34  brain]
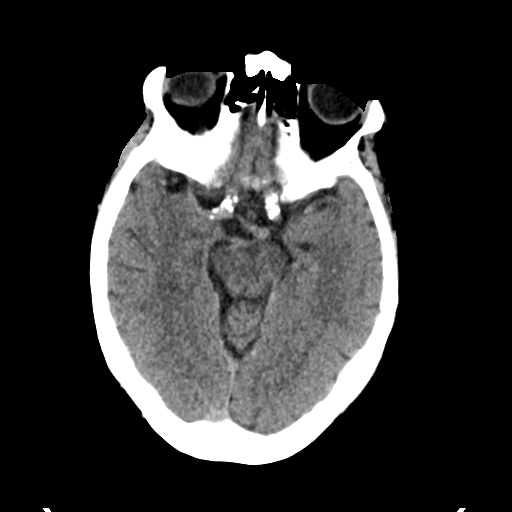
[im 18/34  brain]
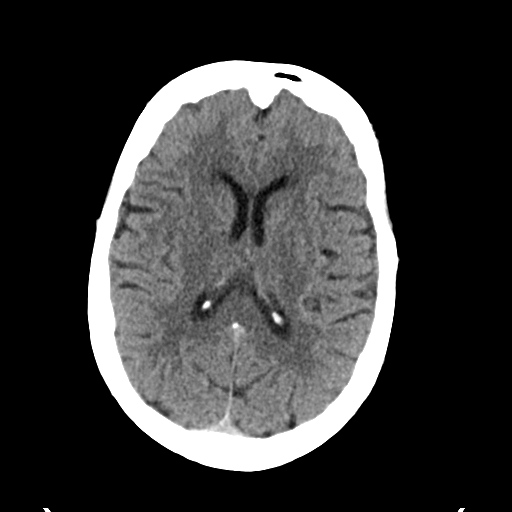
[im 18/34  bone]
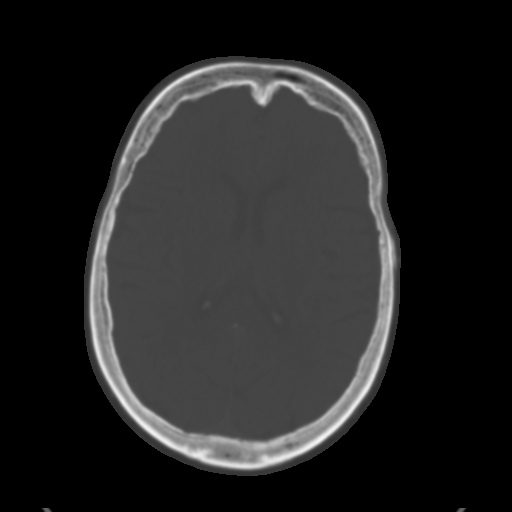
[im 21/34  brain]
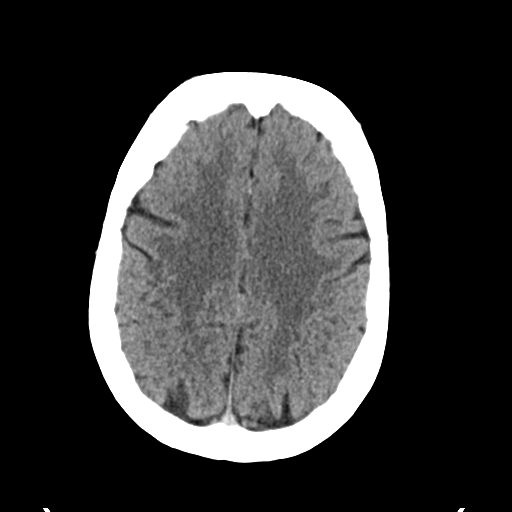
[im 24/34  brain]
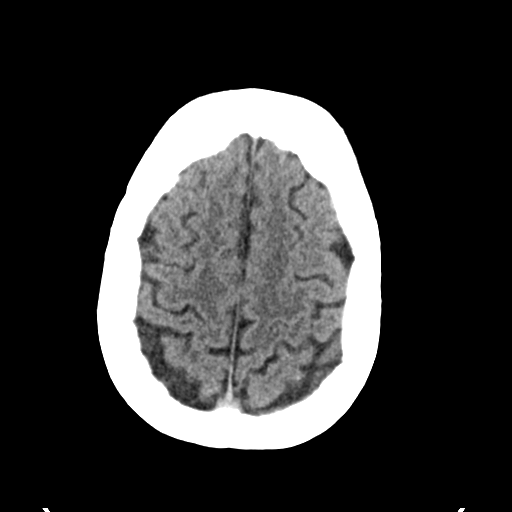
[im 28/34  brain]
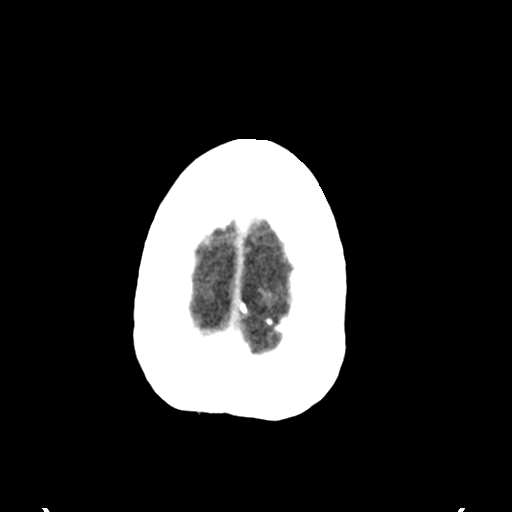
[im 31/34  brain]
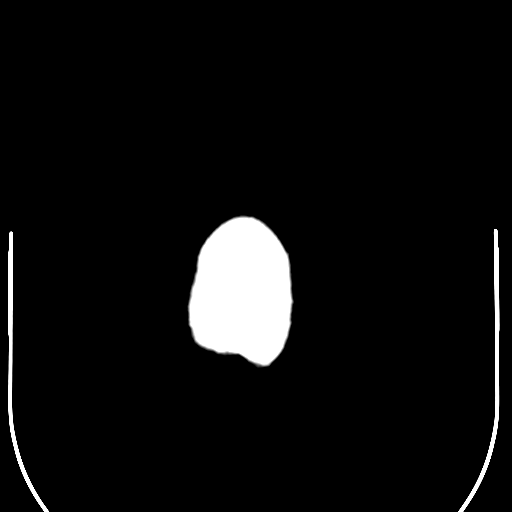
[im 31/34  bone]
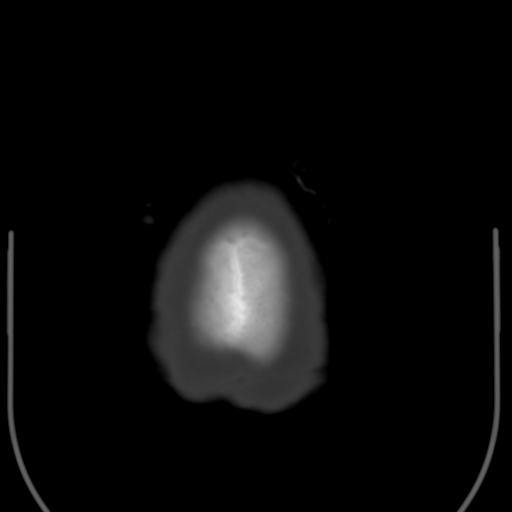

[Series 4: head 3.0 mpr cor · coronal · 0.33mm/px · 3 of 70 slices shown]
[im 24/70  brain]
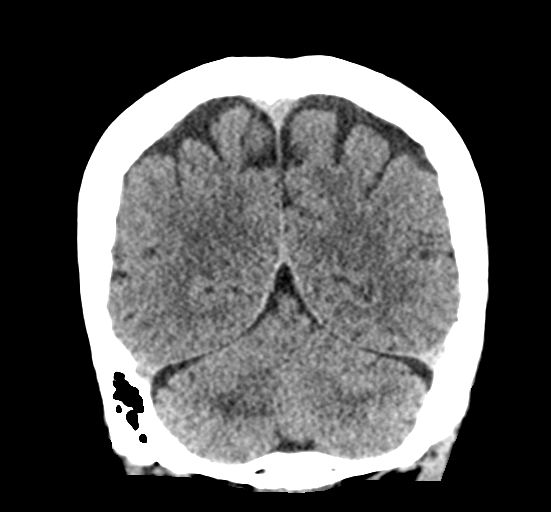
[im 31/70  brain]
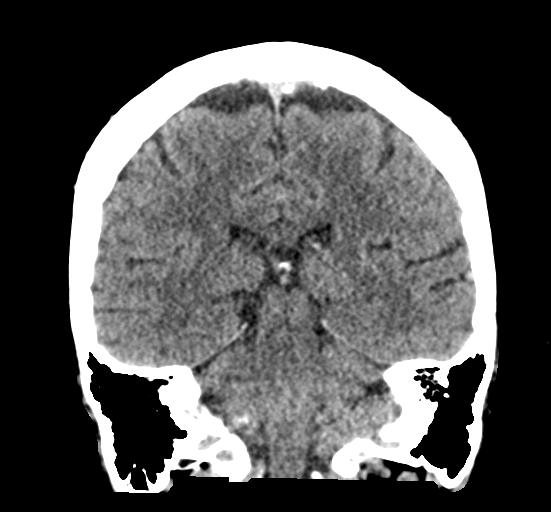
[im 39/70  brain]
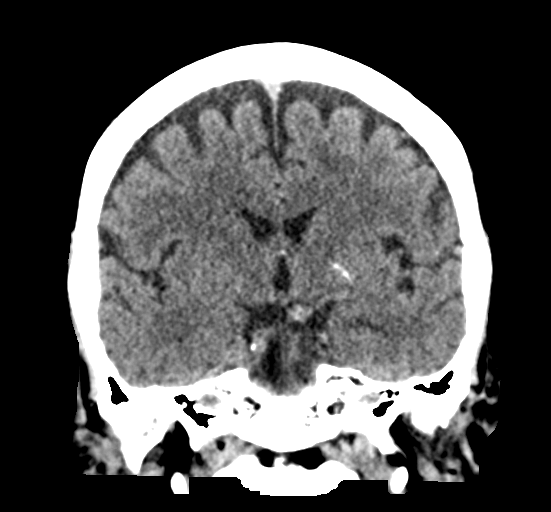

[Series 5: head 3.0 mpr sag · sagittal · 0.33mm/px · 3 of 55 slices shown]
[im 19/55  brain]
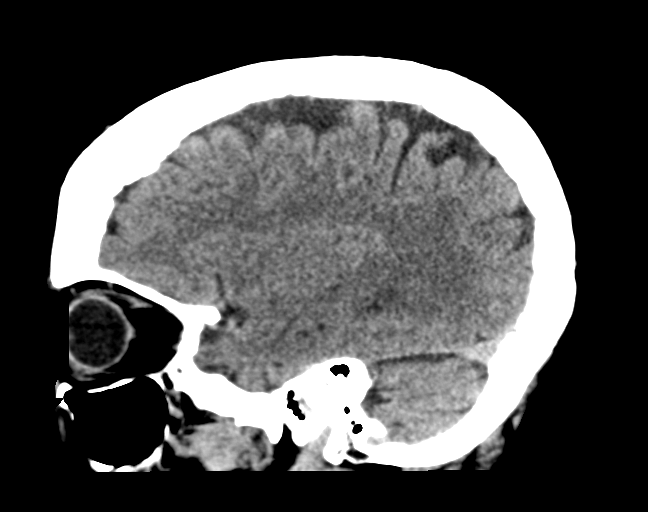
[im 28/55  brain]
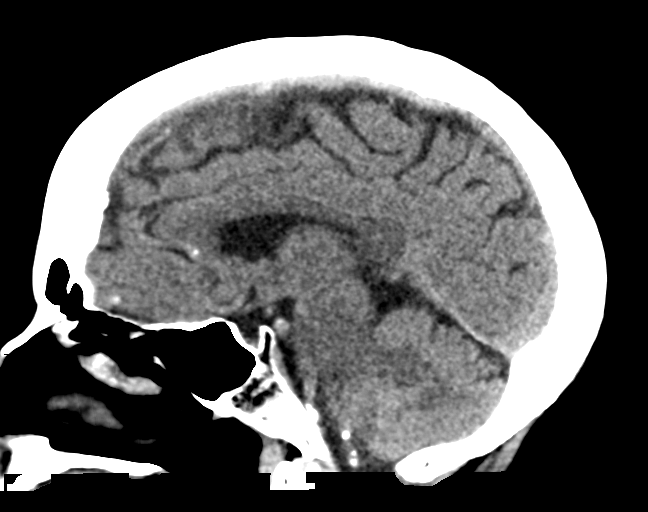
[im 37/55  brain]
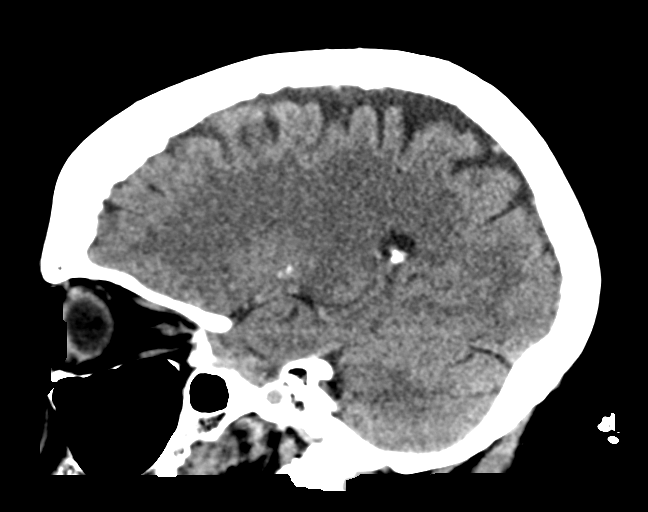

[15 of 47 positions shown; findings below may reference images not displayed]

FINDINGS: Brain: No evidence of acute territorial infarction, hemorrhage,
hydrocephalus,extra-axial collection or mass lesion/mass effect.
There is dilatation the ventricles and sulci consistent with
age-related atrophy. Low-attenuation changes in the deep white
matter consistent with small vessel ischemia.

Vascular: No hyperdense vessel or unexpected calcification. Dense
vascular calcifications are seen throughout. He

Skull: The skull is intact. No fracture or focal lesion identified.

Sinuses/Orbits: The visualized paranasal sinuses and mastoid air
cells are clear. The orbits and globes intact.

Other: None
IMPRESSION: No acute intracranial abnormality.

Findings consistent with age related atrophy and chronic small
vessel ischemia
